# Patient Record
Sex: Female | Born: 1990 | Race: Black or African American | Hispanic: No | Marital: Single | Smoking: Never smoker
Health system: Southern US, Community
[De-identification: ages and names within clinical notes are randomized; demographics above are authoritative.]

## PROBLEM LIST (undated history)

## (undated) DIAGNOSIS — I2699 Other pulmonary embolism without acute cor pulmonale: Secondary | ICD-10-CM

## (undated) HISTORY — PX: NO PAST SURGERIES: SHX2092

---

## 2011-06-16 ENCOUNTER — Emergency Department (HOSPITAL_COMMUNITY)
Admission: EM | Admit: 2011-06-16 | Discharge: 2011-06-16 | Disposition: A | Discharge status: Home / Self Care | Payer: No Typology Code available for payment source | Attending: Emergency Medicine | Admitting: Emergency Medicine

## 2011-06-16 ENCOUNTER — Encounter (HOSPITAL_COMMUNITY): Payer: Self-pay | Admitting: Emergency Medicine

## 2011-06-16 DIAGNOSIS — S61209A Unspecified open wound of unspecified finger without damage to nail, initial encounter: Secondary | ICD-10-CM | POA: Insufficient documentation

## 2011-06-16 DIAGNOSIS — IMO0002 Reserved for concepts with insufficient information to code with codable children: Secondary | ICD-10-CM

## 2011-06-16 DIAGNOSIS — W260XXA Contact with knife, initial encounter: Secondary | ICD-10-CM | POA: Insufficient documentation

## 2011-06-16 MED ORDER — TETANUS-DIPHTH-ACELL PERTUSSIS 5-2.5-18.5 LF-MCG/0.5 IM SUSP
0.5000 mL | Freq: Once | INTRAMUSCULAR | Status: AC
  Administered 2011-06-16: 0.5 mL via INTRAMUSCULAR
  Filled 2011-06-16 (×2): qty 0.5

## 2011-06-16 NOTE — ED Provider Notes (Addendum)
History     CSN: [id number]  Arrival date & time 06/16/11  0026   First MD Initiated Contact with Patient 06/16/11 0054      Chief Complaint  Patient presents with  . Laceration    (Consider location/radiation/quality/duration/timing/severity/associated sxs/prior treatment) HPI Comments: Patient was trying to slice for separate frozen meat with a knife.  Now has a superficial laceration to her left thumb on the medial aspect at the nail edge.  No active bleeding.  This is a flap that I do not believe is viable at the distal edges due to discoloration and thinness of the flap  Patient is a 21 y.o. female presenting with skin laceration. The history is provided by the patient.  Laceration  The incident occurred 3 to 5 hours ago. The laceration mechanism was a a clean knife. The pain is at a severity of 2/10. The pain is mild. Her tetanus status is unknown.    History reviewed. No pertinent past medical history.  History reviewed. No pertinent past surgical history.  No family history on file.  History  Substance Use Topics  . Smoking status: Never Smoker   . Smokeless tobacco: Not on file  . Alcohol Use: No    OB History    Grav Para Term Preterm Abortions TAB SAB Ect Mult Living                  Review of Systems  Constitutional: Negative for activity change.    Allergies  Review of patient's allergies indicates no known allergies.  Home Medications  No current outpatient prescriptions on file.  BP 139/85  Pulse 91  Temp(Src) 98.6 F (37 C) (Oral)  Resp 18  SpO2 100%  LMP 06/01/2011  Physical Exam  Constitutional: She is oriented to person, place, and time. She appears well-developed and well-nourished.  HENT:  Head: Normocephalic.  Eyes: Pupils are equal, round, and reactive to light.  Neck: Normal range of motion.  Cardiovascular: Normal rate.   Pulmonary/Chest: Effort normal.  Neurological: She is alert and oriented to person, place, and time.    Skin:       Superficial flap of the medial left thumb, not including the nail    ED Course  LACERATION REPAIR Date/Time: 06/16/2011 1:14 AM Performed by: Arman Filter Authorized by: Arman Filter Consent: Verbal consent obtained. Risks and benefits: risks, benefits and alternatives were discussed Consent given by: patient Patient identity confirmed: verbally with patient Body area: upper extremity Laceration length: 1 cm Tendon involvement: none Nerve involvement: none Vascular damage: no Anesthetic total: 0 ml Patient sedated: no Irrigation solution: saline Amount of cleaning: extensive Skin closure: glue Patient tolerance: Patient tolerated the procedure well with no immediate complications.   (including critical care time)  Labs Reviewed - No data to display No results found.   1. Laceration       MDM  Superficial flap-type laceration to the medial left thumb from a knife.  No active bleeding.  Area has been cleaned and soaked in a Betadine solution.  Dermabond was used to keep the flap in place        Arman Filter, NP 06/16/11 0118  Arman Filter, NP 06/16/11 0118  Arman Filter, NP 07/11/11 2045

## 2011-06-16 NOTE — ED Provider Notes (Signed)
Medical screening examination/treatment/procedure(s) were performed by non-physician practitioner and as supervising physician I was immediately available for consultation/collaboration.  Ethelda Chick, MD 06/16/11 [phone]

## 2011-06-16 NOTE — ED Notes (Signed)
Left thumb put into Betadine soak by Earley Favor PA

## 2011-06-16 NOTE — ED Notes (Signed)
PT. PRESENTS WITH LACERATION AT LEFT DISTAL THUMB  , ACCIDENTALLY SLICED BY A KITCHEN KNIFE THIS EVENING , MINIMAL BLEEDING.

## 2011-06-16 NOTE — ED Notes (Signed)
Cut L thumb tip while trying to separate meat, cut with knife, knife slipped, "unknown Td", bleeding controlled, lac ~ 1cm, occurred around 2-3 hrs ago.

## 2011-07-12 NOTE — ED Provider Notes (Signed)
Medical screening examination/treatment/procedure(s) were performed by non-physician practitioner and as supervising physician I was immediately available for consultation/collaboration.  Ethelda Chick, MD 07/12/11 [phone]

## 2015-02-18 ENCOUNTER — Inpatient Hospital Stay (HOSPITAL_COMMUNITY)
Admission: EM | Admit: 2015-02-18 | Discharge: 2015-02-21 | DRG: 176 | Disposition: A | Discharge status: Home / Self Care | Payer: No Typology Code available for payment source | Attending: Internal Medicine | Admitting: Internal Medicine

## 2015-02-18 ENCOUNTER — Emergency Department (HOSPITAL_COMMUNITY): Payer: No Typology Code available for payment source

## 2015-02-18 ENCOUNTER — Encounter (HOSPITAL_COMMUNITY): Payer: Self-pay | Admitting: Emergency Medicine

## 2015-02-18 DIAGNOSIS — R5383 Other fatigue: Secondary | ICD-10-CM | POA: Diagnosis present

## 2015-02-18 DIAGNOSIS — Z793 Long term (current) use of hormonal contraceptives: Secondary | ICD-10-CM

## 2015-02-18 DIAGNOSIS — R0602 Shortness of breath: Secondary | ICD-10-CM

## 2015-02-18 DIAGNOSIS — Z791 Long term (current) use of non-steroidal anti-inflammatories (NSAID): Secondary | ICD-10-CM

## 2015-02-18 DIAGNOSIS — I2699 Other pulmonary embolism without acute cor pulmonale: Principal | ICD-10-CM

## 2015-02-18 DIAGNOSIS — Z6841 Body Mass Index (BMI) 40.0 and over, adult: Secondary | ICD-10-CM

## 2015-02-18 DIAGNOSIS — R05 Cough: Secondary | ICD-10-CM

## 2015-02-18 DIAGNOSIS — Z79899 Other long term (current) drug therapy: Secondary | ICD-10-CM

## 2015-02-18 DIAGNOSIS — R0781 Pleurodynia: Secondary | ICD-10-CM | POA: Diagnosis not present

## 2015-02-18 DIAGNOSIS — R042 Hemoptysis: Secondary | ICD-10-CM

## 2015-02-18 DIAGNOSIS — R059 Cough, unspecified: Secondary | ICD-10-CM

## 2015-02-18 LAB — I-STAT TROPONIN, ED: TROPONIN I, POC: 0 ng/mL (ref 0.00–0.08)

## 2015-02-18 LAB — CBC WITH DIFFERENTIAL/PLATELET
BASOS ABS: 0 10*3/uL (ref 0.0–0.1)
Basophils Relative: 0 %
EOS ABS: 0.1 10*3/uL (ref 0.0–0.7)
EOS PCT: 1 %
HCT: 40.2 % (ref 36.0–46.0)
Hemoglobin: 13.6 g/dL (ref 12.0–15.0)
LYMPHS ABS: 3.4 10*3/uL (ref 0.7–4.0)
LYMPHS PCT: 35 %
MCH: 28 pg (ref 26.0–34.0)
MCHC: 33.8 g/dL (ref 30.0–36.0)
MCV: 82.9 fL (ref 78.0–100.0)
MONO ABS: 0.8 10*3/uL (ref 0.1–1.0)
Monocytes Relative: 8 %
Neutro Abs: 5.6 10*3/uL (ref 1.7–7.7)
Neutrophils Relative %: 56 %
PLATELETS: 243 10*3/uL (ref 150–400)
RBC: 4.85 MIL/uL (ref 3.87–5.11)
RDW: 12.9 % (ref 11.5–15.5)
WBC: 9.9 10*3/uL (ref 4.0–10.5)

## 2015-02-18 LAB — I-STAT BETA HCG BLOOD, ED (MC, WL, AP ONLY)

## 2015-02-18 LAB — COMPREHENSIVE METABOLIC PANEL
ALT: 17 U/L (ref 14–54)
AST: 23 U/L (ref 15–41)
Albumin: 3.9 g/dL (ref 3.5–5.0)
Alkaline Phosphatase: 56 U/L (ref 38–126)
Anion gap: 7 (ref 5–15)
BUN: 12 mg/dL (ref 6–20)
CHLORIDE: 106 mmol/L (ref 101–111)
CO2: 25 mmol/L (ref 22–32)
Calcium: 9.6 mg/dL (ref 8.9–10.3)
Creatinine, Ser: 0.76 mg/dL (ref 0.44–1.00)
Glucose, Bld: 97 mg/dL (ref 65–99)
POTASSIUM: 4.2 mmol/L (ref 3.5–5.1)
SODIUM: 138 mmol/L (ref 135–145)
Total Bilirubin: 0.9 mg/dL (ref 0.3–1.2)
Total Protein: 8.5 g/dL — ABNORMAL HIGH (ref 6.5–8.1)

## 2015-02-18 LAB — LIPASE, BLOOD: LIPASE: 24 U/L (ref 22–51)

## 2015-02-18 MED ORDER — ONDANSETRON HCL 4 MG/2ML IJ SOLN
4.0000 mg | Freq: Three times a day (TID) | INTRAMUSCULAR | Status: DC | PRN
  Filled 2015-02-18: qty 2

## 2015-02-18 MED ORDER — ALBUTEROL SULFATE (2.5 MG/3ML) 0.083% IN NEBU
5.0000 mg | INHALATION_SOLUTION | Freq: Once | RESPIRATORY_TRACT | Status: AC
  Administered 2015-02-18: 5 mg via RESPIRATORY_TRACT
  Filled 2015-02-18: qty 6

## 2015-02-18 MED ORDER — OXYCODONE-ACETAMINOPHEN 5-325 MG PO TABS
2.0000 | ORAL_TABLET | Freq: Once | ORAL | Status: AC
  Administered 2015-02-18: 2 via ORAL
  Filled 2015-02-18: qty 2

## 2015-02-18 MED ORDER — IPRATROPIUM BROMIDE 0.02 % IN SOLN
0.5000 mg | Freq: Once | RESPIRATORY_TRACT | Status: AC
  Administered 2015-02-18: 0.5 mg via RESPIRATORY_TRACT
  Filled 2015-02-18: qty 2.5

## 2015-02-18 MED ORDER — IOHEXOL 350 MG/ML SOLN
100.0000 mL | Freq: Once | INTRAVENOUS | Status: AC | PRN
  Administered 2015-02-18: 100 mL via INTRAVENOUS

## 2015-02-18 MED ORDER — ENOXAPARIN SODIUM 150 MG/ML ~~LOC~~ SOLN
1.0000 mg/kg | Freq: Once | SUBCUTANEOUS | Status: AC
  Administered 2015-02-19: 195 mg via SUBCUTANEOUS
  Filled 2015-02-18: qty 1.3

## 2015-02-18 NOTE — ED Notes (Signed)
Made Respiratory aware of neb treatment ordered.  RN will administer.

## 2015-02-18 NOTE — ED Notes (Signed)
Patient transported to CT 

## 2015-02-18 NOTE — ED Provider Notes (Signed)
CSN: [id number]     Arrival date & time 02/18/15  1505 History   First MD Initiated Contact with Patient 02/18/15 1732     Chief Complaint  Patient presents with  . Cough     (Consider location/radiation/quality/duration/timing/severity/associated sxs/prior Treatment) Patient is a 24 y.o. female presenting with cough. The history is provided by the patient and medical records. No language interpreter was used.  Cough Associated symptoms: shortness of breath   Associated symptoms: no chest pain, no diaphoresis, no fever, no headaches, no rash and no wheezing     Cohen Doleman is a 24 y.o. female  with no major medical history presents to the Emergency Department complaining of gradual, persistent, progressively worsening cough, dyspnea on exertion, hemoptysis onset yesterday.  Patient reports midline chest pain since Wednesday.  She reports her chest pain is aggravated with deep breathing and is located more on the left side of her chest. It radiates from the left anterior chest to underneath the left breast. She reports associated dyspnea on exertion since this morning. She reports she does take oral contraception tablets but denies any additional exogenous estrogen. She denies palpitations. Patient denies recent travel. She specifically denies travel outside of the country and has no history of TB. She denies swelling in her legs, pain in her legs, history of DVT, recent fracture or surgery. No treatments prior to arrival. Nothing seems to make the symptoms better. Patient denies fever, chills, headache, neck pain, abdominal pain, nausea, vomiting, diarrhea, syncope, dysuria.   History reviewed. No pertinent past medical history. History reviewed. No pertinent past surgical history. History reviewed. No pertinent family history. Social History  Substance Use Topics  . Smoking status: Never Smoker   . Smokeless tobacco: None  . Alcohol Use: No   OB History    No data available      Review of Systems  Constitutional: Negative for fever, diaphoresis, appetite change, fatigue and unexpected weight change.  HENT: Negative for mouth sores.   Eyes: Negative for visual disturbance.  Respiratory: Positive for cough and shortness of breath. Negative for chest tightness and wheezing.        Hemoptysis  Cardiovascular: Negative for chest pain.  Gastrointestinal: Negative for nausea, vomiting, abdominal pain, diarrhea and constipation.  Endocrine: Negative for polydipsia, polyphagia and polyuria.  Genitourinary: Negative for dysuria, urgency, frequency and hematuria.  Musculoskeletal: Negative for back pain and neck stiffness.  Skin: Negative for rash.  Allergic/Immunologic: Negative for immunocompromised state.  Neurological: Negative for syncope, light-headedness and headaches.  Hematological: Does not bruise/bleed easily.  Psychiatric/Behavioral: Negative for sleep disturbance. The patient is not nervous/anxious.       Allergies  Review of patient's allergies indicates no known allergies.  Home Medications   Prior to Admission medications   Medication Sig Start Date End Date Taking? Authorizing Provider  cyclobenzaprine (FLEXERIL) 5 MG tablet Take 5 mg by mouth 3 (three) times daily as needed for muscle spasms.   Yes Historical Provider, MD  ibuprofen (ADVIL,MOTRIN) 800 MG tablet Take 800 mg by mouth every 8 (eight) hours as needed for fever, headache, mild pain, moderate pain or cramping.   Yes Historical Provider, MD  levonorgestrel-ethinyl estradiol (LEVORA 0.15/30, 28,) 0.15-30 MG-MCG tablet Take 1 tablet by mouth daily.   Yes Historical Provider, MD  naproxen (NAPROSYN) 500 MG tablet Take 500 mg by mouth 2 (two) times daily.   Yes Historical Provider, MD   BP 128/77 mmHg  Pulse 104  Temp(Src) 98.2 F (36.8 C) (Oral)  Resp 23  SpO2 97%  LMP 02/16/2015 Physical Exam  Constitutional: She appears well-developed and well-nourished. No distress.  Awake,  alert, nontoxic appearance Obese  HENT:  Head: Normocephalic and atraumatic.  Mouth/Throat: Oropharynx is clear and moist. No oropharyngeal exudate.  Eyes: Conjunctivae are normal. No scleral icterus.  Neck: Normal range of motion. Neck supple.  Cardiovascular: Regular rhythm, normal heart sounds and intact distal pulses.  Tachycardia present.   Pulses:      Radial pulses are 2+ on the right side, and 2+ on the left side.  Capillary refill < 3 sec  Pulmonary/Chest: Effort normal. No respiratory distress. She has decreased breath sounds (throughout). She has no wheezes.  Equal chest expansion Diminished breath sounds throughout  Abdominal: Soft. Bowel sounds are normal. She exhibits no mass. There is no tenderness. There is no rebound and no guarding.  Soft and nontender  Musculoskeletal: Normal range of motion. She exhibits no edema.  No calf tenderness No pitting edema Negative Homans sign   Neurological: She is alert.  Speech is clear and goal oriented Moves extremities without ataxia  Skin: Skin is warm and dry. She is not diaphoretic.  Psychiatric: She has a normal mood and affect.  Nursing note and vitals reviewed.   ED Course  Procedures (including critical care time) Labs Review Labs Reviewed  COMPREHENSIVE METABOLIC PANEL - Abnormal; Notable for the following:    Total Protein 8.5 (*)    All other components within normal limits  CBC WITH DIFFERENTIAL/PLATELET  LIPASE, BLOOD  ANTITHROMBIN III  PROTEIN C ACTIVITY  PROTEIN C, TOTAL  PROTEIN S ACTIVITY  PROTEIN S, TOTAL  LUPUS ANTICOAGULANT PANEL  BETA-2-GLYCOPROTEIN I ABS, IGG/M/A  HOMOCYSTEINE  FACTOR 5 LEIDEN  PROTHROMBIN GENE MUTATION  CARDIOLIPIN ANTIBODIES, IGG, IGM, IGA  BRAIN NATRIURETIC PEPTIDE  I-STAT TROPOININ, ED  I-STAT BETA HCG BLOOD, ED ([facility], [facility], [facility] ONLY)    Imaging Review Dg Chest 2 View  02/18/2015   CLINICAL DATA:  Midsternal chest pain with cough.  EXAM: CHEST  2 VIEW  COMPARISON:  None.   FINDINGS: There is no focal parenchymal opacity. There is no pleural effusion or pneumothorax. There is cardiomegaly.  The osseous structures are unremarkable.  IMPRESSION: Cardiomegaly.  Otherwise, no acute cardiopulmonary disease.   Electronically Signed   By: Elige Ko   On: 02/18/2015 16:01   Ct Angio Chest Pe W/cm &/or Wo Cm  02/18/2015   CLINICAL DATA:  Short of breath with chest pain and hemoptysis today.  EXAM: CT ANGIOGRAPHY CHEST WITH CONTRAST  TECHNIQUE: Multidetector CT imaging of the chest was performed using the standard protocol during bolus administration of intravenous contrast. Multiplanar CT image reconstructions and MIPs were obtained to evaluate the vascular anatomy.  CONTRAST:  OMNIPAQUE IOHEXOL 350 MG/ML SOLN  COMPARISON:  Chest radiography same day  FINDINGS: The examination is quite limited because of the patient's large size and poor bolus timing. Nonetheless, there is definite pulmonary embolism with emboli visible in the main pulmonary artery bifurcation on the left and in the right lower lobe pulmonary arterial tree. Difficulty measure the right ventricular to left ventricular ratio, but I do not think there is evidence of right heart strain. There is patchy atelectasis in both lower lobes related to the emboli. No pleural or pericardial fluid. No spinal abnormality.  Review of the MIP images confirms the above findings.  IMPRESSION: Limited quality study, but definitely positive for bilateral pulmonary emboli. I do not think the study shows  evidence of right heart strain.  These results were called by telephone at the time of interpretation on 02/18/2015 at 10:00 pm to Dr. Dierdre Forth , who verbally acknowledged these results.   Electronically Signed   By: Paulina Fusi M.D.   On: 02/18/2015 22:02   I have personally reviewed and evaluated these images and lab results as part of my medical decision-making.   EKG Interpretation None      MDM   Final  diagnoses:  Other acute pulmonary embolism without acute cor pulmonale (HCC)  SOB (shortness of breath)  Cough  Hemoptysis   Shaneque Merkle presents with cough, dyspnea on exertion and hemoptysis. Patient coughs up bloody sputum while here in the emergency department. Mild tachycardia to 105 on arrival. Will give albuterol and obtain CT angiogram.  10:46 PM Patient with bilateral PE on CT angios scan. Anticoagulation panel drawn. Will need admission.  11:18 PM Discussed with Dr. Dr. Robb Matar who will admit to tele.  Pt will be given lovenox.    BP 128/77 mmHg  Pulse 104  Temp(Src) 98.2 F (36.8 C) (Oral)  Resp 23  SpO2 97%  LMP 02/16/2015   Dahlia Client Prarthana Parlin, PA-C 02/18/15 1610  Alvira Monday, MD 02/22/15 1732

## 2015-02-18 NOTE — ED Notes (Addendum)
Pt reports midline chest discomfort since Wednesday associated with belching. On Thursday she began to have a cough with bloody sputum. Pt having pain on L side chest only with deep breathing. Also reports sob worse than usual. Pt speaking in full sentences with no difficulty. Also having L shoulder pain, but thinks it may be related to sleeping on it the wrong way.

## 2015-02-18 NOTE — ED Notes (Signed)
Spoke with main lab phlebotomy. Pt has already been stuck multiple times for panel. Main lab phlebotomy to come down and stick pt

## 2015-02-19 ENCOUNTER — Encounter (HOSPITAL_COMMUNITY): Payer: Self-pay | Admitting: *Deleted

## 2015-02-19 ENCOUNTER — Inpatient Hospital Stay (HOSPITAL_COMMUNITY): Payer: No Typology Code available for payment source

## 2015-02-19 DIAGNOSIS — Z793 Long term (current) use of hormonal contraceptives: Secondary | ICD-10-CM

## 2015-02-19 DIAGNOSIS — I2699 Other pulmonary embolism without acute cor pulmonale: Secondary | ICD-10-CM

## 2015-02-19 LAB — CBC
HCT: 39.5 % (ref 36.0–46.0)
HEMOGLOBIN: 12.4 g/dL (ref 12.0–15.0)
MCH: 26.7 pg (ref 26.0–34.0)
MCHC: 31.4 g/dL (ref 30.0–36.0)
MCV: 85.1 fL (ref 78.0–100.0)
Platelets: 237 10*3/uL (ref 150–400)
RBC: 4.64 MIL/uL (ref 3.87–5.11)
RDW: 13.3 % (ref 11.5–15.5)
WBC: 9 10*3/uL (ref 4.0–10.5)

## 2015-02-19 LAB — BRAIN NATRIURETIC PEPTIDE: B NATRIURETIC PEPTIDE 5: 25.3 pg/mL (ref 0.0–100.0)

## 2015-02-19 LAB — COMPREHENSIVE METABOLIC PANEL
ALT: 15 U/L (ref 14–54)
ANION GAP: 8 (ref 5–15)
AST: 16 U/L (ref 15–41)
Albumin: 3.4 g/dL — ABNORMAL LOW (ref 3.5–5.0)
Alkaline Phosphatase: 51 U/L (ref 38–126)
BUN: 14 mg/dL (ref 6–20)
CHLORIDE: 105 mmol/L (ref 101–111)
CO2: 25 mmol/L (ref 22–32)
Calcium: 9.2 mg/dL (ref 8.9–10.3)
Creatinine, Ser: 0.82 mg/dL (ref 0.44–1.00)
GFR calc Af Amer: >60 mL/min (ref >60)
GFR calc non Af Amer: >60 mL/min (ref >60)
Glucose, Bld: 109 mg/dL — ABNORMAL HIGH (ref 65–99)
Potassium: 3.7 mmol/L (ref 3.5–5.1)
SODIUM: 138 mmol/L (ref 135–145)
Total Bilirubin: 0.7 mg/dL (ref 0.3–1.2)
Total Protein: 7.8 g/dL (ref 6.5–8.1)

## 2015-02-19 LAB — PROTIME-INR
INR: 1.19 (ref 0.00–1.49)
PROTHROMBIN TIME: 15.3 s — AB (ref 11.6–15.2)

## 2015-02-19 LAB — ANTITHROMBIN III: ANTITHROMB III FUNC: 93 % (ref 75–120)

## 2015-02-19 MED ORDER — PATIENT'S GUIDE TO USING COUMADIN BOOK
Freq: Once | Status: AC
  Administered 2015-02-19: 1
  Filled 2015-02-19: qty 1

## 2015-02-19 MED ORDER — ONDANSETRON HCL 4 MG/2ML IJ SOLN: 4.0000 mg | Freq: Four times a day (QID) | INTRAMUSCULAR | Status: AC | PRN

## 2015-02-19 MED ORDER — SODIUM CHLORIDE 0.9 % IJ SOLN
3.0000 mL | Freq: Two times a day (BID) | INTRAMUSCULAR | Status: DC
  Administered 2015-02-19 – 2015-02-21 (×6): 3 mL via INTRAVENOUS

## 2015-02-19 MED ORDER — ENOXAPARIN SODIUM 150 MG/ML ~~LOC~~ SOLN
200.0000 mg | Freq: Two times a day (BID) | SUBCUTANEOUS | Status: DC
  Administered 2015-02-19: 200 mg via SUBCUTANEOUS
  Filled 2015-02-19 (×5): qty 2

## 2015-02-19 MED ORDER — HYDROMORPHONE HCL 1 MG/ML IJ SOLN: 1.0000 mg | INTRAMUSCULAR | Status: DC | PRN

## 2015-02-19 MED ORDER — HYDROMORPHONE HCL 1 MG/ML IJ SOLN
1.0000 mg | INTRAMUSCULAR | Status: DC | PRN
  Administered 2015-02-19 (×5): 1 mg via INTRAVENOUS
  Filled 2015-02-19 (×5): qty 1

## 2015-02-19 MED ORDER — FAMOTIDINE 20 MG PO TABS
40.0000 mg | ORAL_TABLET | Freq: Two times a day (BID) | ORAL | Status: DC
  Administered 2015-02-19 – 2015-02-21 (×6): 40 mg via ORAL
  Filled 2015-02-19 (×6): qty 2

## 2015-02-19 MED ORDER — HYDROMORPHONE HCL 2 MG/ML IJ SOLN
2.0000 mg | Freq: Once | INTRAMUSCULAR | Status: AC
  Administered 2015-02-20: 2 mg via INTRAVENOUS
  Filled 2015-02-19: qty 1

## 2015-02-19 MED ORDER — ENOXAPARIN SODIUM 150 MG/ML ~~LOC~~ SOLN
200.0000 mg | Freq: Two times a day (BID) | SUBCUTANEOUS | Status: DC
  Filled 2015-02-19 (×2): qty 2

## 2015-02-19 MED ORDER — WARFARIN SODIUM 5 MG PO TABS
10.0000 mg | ORAL_TABLET | Freq: Once | ORAL | Status: AC
  Administered 2015-02-19: 10 mg via ORAL
  Filled 2015-02-19: qty 2

## 2015-02-19 MED ORDER — WARFARIN - PHARMACIST DOSING INPATIENT
Freq: Every day | Status: DC
Start: 2015-02-19 — End: 2015-02-21
  Administered 2015-02-19: 18:00:00

## 2015-02-19 MED ORDER — KETOROLAC TROMETHAMINE 30 MG/ML IJ SOLN
30.0000 mg | Freq: Once | INTRAMUSCULAR | Status: AC
  Administered 2015-02-19: 30 mg via INTRAVENOUS
  Filled 2015-02-19: qty 1

## 2015-02-19 MED ORDER — OXYCODONE-ACETAMINOPHEN 5-325 MG PO TABS
1.0000 | ORAL_TABLET | ORAL | Status: DC | PRN
  Administered 2015-02-20 (×3): 1 via ORAL
  Filled 2015-02-19 (×3): qty 1

## 2015-02-19 NOTE — Progress Notes (Signed)
Pt admitted after midnight. Please refer to admission note done today 02/19/2015.  Pt admitted for new pulmonary embolism Started lovenox on admission Add coumadin tonight  Manson Passey [facility] [phone]

## 2015-02-19 NOTE — Progress Notes (Signed)
VASCULAR LAB PRELIMINARY  PRELIMINARY  PRELIMINARY  PRELIMINARY  Bilateral lower extremity venous duplex completed.    Preliminary report:  There is no obvious evidence of DVT or SVT noted in the visualized veins of the bilateral lower extremities.  However, this was a technically difficult and limited study secondary to body habitus and inability to lay back during exam.  Shondra Capps, RVT 02/19/2015, 5:41 PM

## 2015-02-19 NOTE — Progress Notes (Addendum)
ANTICOAGULATION CONSULT NOTE - Initial Consult  Pharmacy Consult for Warfarin/Lovenox Indication: pulmonary embolus  No Known Allergies  Patient Measurements: Height:  (165.1 cm) Weight: (!) 427 lb 8 oz (193.913 kg) IBW/kg (Calculated) : 57  Vital Signs: Temp: 98.7 F (37.1 C) (10/08 1421) Temp Source: Oral (10/08 1421) BP: 132/82 mmHg (10/08 1421) Pulse Rate: 84 (10/08 1421)  Labs:  Recent Labs  02/18/15 1743 02/19/15 0535  HGB 13.6 12.4  HCT 40.2 39.5  PLT 243 237  CREATININE 0.76 0.82   Estimated Creatinine Clearance: 188.3 mL/min (by C-G formula based on Cr of 0.82).  Medical History: History reviewed. No pertinent past medical history.  Medications:  Scheduled:  . famotidine  40 mg Oral BID  . sodium chloride  3 mL Intravenous Q12H   Assessment: 24 yoF with PE, on BCP prior to admission. Has received one dose of Lovenox 195 mg in ED early this am, needs to continue until INR therapeutic x 48 hr. Pharmacy to dose Warfarin, requested Pharmacy protocol for Lovenox as well, waiting for response  Goal of Therapy:  INR 2-3 Anti-Xa level 0.6-1 units/ml 4hrs after LMWH dose given Monitor platelets by anticoagulation protocol: Yes   Plan:   Hyper-coag panel in process  Ordered baseline INR and daily, dose Warfarin based on level  Waiting for MD to order Lovenox dosing  Otho Bellows PharmD Pager [phone] 02/19/2015, 3:06 PM   Addendum:  Pharmacy now consulted to dose Lovenox Warfarin  PO x 1 tonight Daily PT/INR Provide warfarin education prior to discharge Lovenox 1 mg/kg ( ) SQ q12h Monitor renal function, CBC, signs/symptoms of bleeding  Loralee Pacas, PharmD, BCPS Pager: [phone] 02/19/2015 3:48 PM

## 2015-02-19 NOTE — H&P (Signed)
[facility] History and Physical  Regina Koch ZOX:[id number] DOB: 03-15-1991 DOA: 02/18/2015  Referring physician: Dierdre Forth, PA-C PCP: No primary care provider on file.   Chief Complaint: Cough.  HPI: Regina Koch is a 24 y.o. female with a past medical history of morbid obesity who comes to the ER due to shortness of breath for 2 days associated with pleuritic chest pain, hemoptysis. Per patient, on Wednesday evening she had sudden chest pain, associated with mild dyspnea and fatigue. She took some over-the-counter analgesics, but could not sleep very well. Next day she noticed some hemoptysis, and she called her doctor and was prescribed Flexeril, which did not help enough with the pain. She denies any long air or motor vehicle travel. She denies any long sitting periods. She denies any previous history of thromboembolism or family history of it. She uses birth control pills, but denies cigarette smoking.  She denies dizziness, diaphoresis, palpitations, orthopnea, PND or pitting edema of the lower extremities. She complains of mild nausea, but denies emesis, diarrhea, constipation, melena, hematochezia. She denies urinary symptoms.  When seen in the emergency department, the patient was in no acute distress. She stated that her pain was better, but still at about 5/10 in intensity. She is stated that she was breathing better with the supplemental oxygen.  Review of Systems:  Constitutional:  Positive fatigue. No weight loss, night sweats, Fevers, chills,   HEENT:  No headaches, Difficulty swallowing,Tooth/dental problems,Sore throat,  No sneezing, itching, ear ache, nasal congestion, post nasal drip,  Cardio-vascular:  No chest pain, Orthopnea, PND, swelling in lower extremities, anasarca, dizziness, palpitations  GI:  Positive nausea. No heartburn, indigestion, abdominal pain,  vomiting, diarrhea, change in bowel habits, loss of appetite  Resp:  Positive  shortness of breath with exertion or at rest. Positive hemoptysis. Positive pleuritic chest pain.  No excess mucus, no productive cough, No non-productive cough.No change in color of mucus.No wheezing.No chest wall deformity  Skin:  no rash or lesions.  GU:  no dysuria, change in color of urine, no urgency or frequency. No flank pain.  Musculoskeletal:  No joint pain or swelling. No decreased range of motion. No back pain.  Psych:  No change in mood or affect. No depression or anxiety. No memory loss.   History reviewed. No pertinent past medical history. History reviewed. No pertinent past surgical history. Social History:  reports that she has never smoked. She does not have any smokeless tobacco history on file. She reports that she does not drink alcohol or use illicit drugs.  No Known Allergies  History reviewed. No pertinent family history.   Prior to Admission medications   Medication Sig Start Date End Date Taking? Authorizing Provider  cyclobenzaprine (FLEXERIL) 5 MG tablet Take 5 mg by mouth 3 (three) times daily as needed for muscle spasms.   Yes Historical Provider, MD  ibuprofen (ADVIL,MOTRIN) 800 MG tablet Take 800 mg by mouth every 8 (eight) hours as needed for fever, headache, mild pain, moderate pain or cramping.   Yes Historical Provider, MD  levonorgestrel-ethinyl estradiol (LEVORA 0.15/30, 28,) 0.15-30 MG-MCG tablet Take 1 tablet by mouth daily.   Yes Historical Provider, MD  naproxen (NAPROSYN) 500 MG tablet Take 500 mg by mouth 2 (two) times daily.   Yes Historical Provider, MD   Physical Exam: Filed Vitals:   02/18/15 2100 02/18/15 2130 02/18/15 2317 02/19/15 0016  BP: 146/84 128/77  154/99  Pulse: 107 104  105  Temp:    99.7  F (37.6 C)  TempSrc:    Oral  Resp: Height:     (1.651 m)  Weight:   193.913 kg (427 lb 8 oz) 193.686 kg (427 lb)  SpO2: 98% 97%  98%    Wt Readings from Last 3 Encounters:  02/19/15 193.686 kg (427 lb)     General:  Appears calm and comfortable Eyes: PERRL, normal lids, irises & conjunctiva ENT: grossly normal hearing, lips & tongue Neck: no LAD, masses or thyromegaly Cardiovascular: RRR, no m/r/g. No LE edema. Telemetry: SR, no arrhythmias  Respiratory: CTA bilaterally, no w/r/r. Normal respiratory effort. Abdomen: Obese, soft, ntnd Skin: no rash or induration seen on limited exam Musculoskeletal: grossly normal tone BUE/BLE Psychiatric: grossly normal mood and affect, speech fluent and appropriate Neurologic: grossly non-focal.          Labs on Admission:  Basic Metabolic Panel:  Recent Labs Lab 02/18/15 1743  NA 138  K 4.2  CL 106  CO2 25  GLUCOSE 97  BUN 12  CREATININE 0.76  CALCIUM 9.6   Liver Function Tests:  Recent Labs Lab 02/18/15 1743  AST 23  ALT 17  ALKPHOS 56  BILITOT 0.9  PROT 8.5*  ALBUMIN 3.9    Recent Labs Lab 02/18/15 1743  LIPASE 24   CBC:  Recent Labs Lab 02/18/15 1743  WBC 9.9  NEUTROABS 5.6  HGB 13.6  HCT 40.2  MCV 82.9  PLT 243    BNP (last 3 results)  Recent Labs  02/18/15 2318  BNP 25.3     Radiological Exams on Admission: Dg Chest 2 View  02/18/2015   CLINICAL DATA:  Midsternal chest pain with cough.  EXAM: CHEST  2 VIEW  COMPARISON:  None.  FINDINGS: There is no focal parenchymal opacity. There is no pleural effusion or pneumothorax. There is cardiomegaly.  The osseous structures are unremarkable.  IMPRESSION: Cardiomegaly.  Otherwise, no acute cardiopulmonary disease.   Electronically Signed   By: Elige Ko   On: 02/18/2015 16:01   Ct Angio Chest Pe W/cm &/or Wo Cm  02/18/2015   CLINICAL DATA:  Short of breath with chest pain and hemoptysis today.  EXAM: CT ANGIOGRAPHY CHEST WITH CONTRAST  TECHNIQUE: Multidetector CT imaging of the chest was performed using the standard protocol during bolus administration of intravenous contrast. Multiplanar CT image reconstructions and MIPs were obtained to evaluate the  vascular anatomy.  CONTRAST:  OMNIPAQUE IOHEXOL 350 MG/ML SOLN  COMPARISON:  Chest radiography same day  FINDINGS: The examination is quite limited because of the patient's large size and poor bolus timing. Nonetheless, there is definite pulmonary embolism with emboli visible in the main pulmonary artery bifurcation on the left and in the right lower lobe pulmonary arterial tree. Difficulty measure the right ventricular to left ventricular ratio, but I do not think there is evidence of right heart strain. There is patchy atelectasis in both lower lobes related to the emboli. No pleural or pericardial fluid. No spinal abnormality.  Review of the MIP images confirms the above findings.  IMPRESSION: Limited quality study, but definitely positive for bilateral pulmonary emboli. I do not think the study shows evidence of right heart strain.  These results were called by telephone at the time of interpretation on 02/18/2015 at 10:00 pm to Dr. Dierdre Forth , who verbally acknowledged these results.   Electronically Signed   By: Paulina Fusi M.D.   On: 02/18/2015 22:02    EKG:  Independently reviewed. Vent. rate 95 BPM PR interval 165 ms QRS duration 98 ms QT/QTc 355/446 ms P-R-T axes 34 55 30 Sinus rhythm ST elev, probable normal early repol pattern  Assessment/Plan Principal Problem:   Bilateral pulmonary embolism (HCC) Admit to telemetry for cardiac monitoring. Lovenox at therapeutic dosage. Check echocardiogram in the morning to evaluate for possible right-sided heart strain. Check antithrombin III, protein C and protein S activity, check total protein C and total protein S, Check homocystine, check lupus anticoagulant, check factor V Leyden, check cardiolipin antibodies, check prothrombin gene mutation, check beta 2 glycoprotein antibodies. Patient to discontinue oral contraceptive pills and start lifestyle modifications for obesity to decrease further risk of embolic events.  Active  Problems:     Morbid obesity (HCC) Recommended lifestyle modifications.    Code Status: Full code. DVT Prophylaxis: On full dose anticoagulation with Lovenox. Family Communication:  Disposition Plan: Admit to telemetry for cardiac monitoring and start Lovenox at therapeutic doses.  Time spent: Over 60 minutes were spent in the process of this admission.  Bobette Mo [facility] Pager [phone].

## 2015-02-20 DIAGNOSIS — R0781 Pleurodynia: Secondary | ICD-10-CM

## 2015-02-20 LAB — PROTIME-INR
INR: 1.19 (ref 0.00–1.49)
Prothrombin Time: 15.2 seconds (ref 11.6–15.2)

## 2015-02-20 MED ORDER — ENOXAPARIN SODIUM 150 MG/ML ~~LOC~~ SOLN
200.0000 mg | Freq: Two times a day (BID) | SUBCUTANEOUS | Status: DC
  Filled 2015-02-20 (×3): qty 2

## 2015-02-20 MED ORDER — ENOXAPARIN SODIUM 150 MG/ML ~~LOC~~ SOLN
200.0000 mg | SUBCUTANEOUS | Status: AC
  Administered 2015-02-20: 200 mg via SUBCUTANEOUS
  Filled 2015-02-20: qty 2

## 2015-02-20 MED ORDER — ENOXAPARIN SODIUM 150 MG/ML ~~LOC~~ SOLN
200.0000 mg | Freq: Two times a day (BID) | SUBCUTANEOUS | Status: DC
  Administered 2015-02-20 – 2015-02-21 (×2): 200 mg via SUBCUTANEOUS
  Filled 2015-02-20: qty 1.33
  Filled 2015-02-20: qty 2

## 2015-02-20 MED ORDER — WARFARIN SODIUM 5 MG PO TABS
10.0000 mg | ORAL_TABLET | Freq: Once | ORAL | Status: AC
  Administered 2015-02-20: 10 mg via ORAL
  Filled 2015-02-20: qty 2

## 2015-02-20 NOTE — Progress Notes (Addendum)
Patient ID: Regina Koch, female   DOB: September 08, 1990, 24 y.o.   MRN: [id number] [facility] PROGRESS NOTE  Marty Sadlowski WUJ:[id number] DOB: 1990-07-19 DOA: 02/18/2015 PCP: No primary care provider on file. - will set up with [facility] on discharge   Brief narrative:    24 y.o. female with a past medical history of morbid obesity, on OCP for menstrual bleeding control who presented to [facility] ED with worsening shortness of breath for 2 days piror to this admission. Pt also had associated chest pain with breathing. Pt was found to have pulmonary embolism and was started on Lovenox.   Anticipated discharge: 10/10 if she goes home on xarelto otherwise home once INR 2-3.  Assessment/Plan:    Principal Problem:   Bilateral pulmonary embolism (HCC) /  On oral contraceptive pills for non-contraception indication / Pleuritic chest pain  - Likely triggered by OCP use - CT angio chest showed bilateral pulmonary emboli. No evidence of right heart strain. - She is on Lovenox and coumadin - INR 1.19 this am - Continue to monitor daily INR  Active Problems:   Morbid obesity (HCC) - Counseled on diet    DVT Prophylaxis  - On Lovenox and coumadin for PE   Code Status: Full.  Family Communication:  plan of care discussed with the patient Disposition Plan: Home 10/10.  IV access:  Peripheral IV  Procedures and diagnostic studies:    Dg Chest 2 View 02/18/2015   Cardiomegaly.  Otherwise, no acute cardiopulmonary disease.   Electronically Signed   By: Elige Ko   On: 02/18/2015 16:01   Ct Angio Chest Pe W/cm &/or Wo Cm 02/18/2015  Limited quality study, but definitely positive for bilateral pulmonary emboli. I do not think the study shows evidence of right heart strain.  These results were called by telephone at the time of interpretation on 02/18/2015 at 10:00 pm to Dr. Dierdre Forth , who verbally acknowledged these results.   Electronically Signed   By: Paulina Fusi M.D.   On: 02/18/2015 22:02    LE doppler 02/19/2015 - no DVT   Medical Consultants:  None   Other Consultants:  None   IAnti-Infectives:   None    Manson Passey, MD  [facility] Pager [phone]  Time spent in minutes: 25 minutes  If 7PM-7AM, please contact night-coverage [email or web address] Password [email or web address] 02/20/2015, 10:56 AM   LOS: 2 days    HPI/Subjective: No acute overnight events. Patient reports having chest pain with coughing.   Objective: Filed Vitals:   02/19/15 1421 02/19/15 2141 02/20/15 0601 02/20/15 0841  BP: 132/82 146/97 143/96 107/51  Pulse: 84 102 90 88  Temp: 98.7 F (37.1 C) 99 F (37.2 C) 98.9 F (37.2 C) 98.6 F (37 C)  TempSrc: Oral Oral Oral Oral  Resp: Height:      Weight:      SpO2: 98% 98% 97% 97%    Intake/Output Summary (Last 24 hours) at 02/20/15 1056 Last data filed at 02/20/15 1049  Gross per 24 hour  Intake    243 ml  Output      2 ml  Net    241 ml    Exam:   General:  Pt is alert, follows commands appropriately, not in acute distress  Cardiovascular: Regular rate and rhythm, S1/S2, no murmurs  Respiratory: Clear to auscultation bilaterally, no wheezing, no crackles, no rhonchi  Abdomen: Soft, non tender, non distended, bowel sounds present  Extremities: No edema, pulses  DP and PT palpable bilaterally  Neuro: Grossly nonfocal  Data Reviewed: Basic Metabolic Panel:  Recent Labs Lab 02/18/15 1743 02/19/15 0535  NA 138 138  K 4.2 3.7  CL 106 105  CO2 25 25  GLUCOSE 97 109*  BUN 12 14  CREATININE 0.76 0.82  CALCIUM 9.6 9.2   Liver Function Tests:  Recent Labs Lab 02/18/15 1743 02/19/15 0535  AST 23 16  ALT 17 15  ALKPHOS 56 51  BILITOT 0.9 0.7  PROT 8.5* 7.8  ALBUMIN 3.9 3.4*    Recent Labs Lab 02/18/15 1743  LIPASE 24   No results for input(s): AMMONIA in the last 168 hours. CBC:  Recent Labs Lab 02/18/15 1743 02/19/15 0535  WBC 9.9 9.0  NEUTROABS 5.6  --   HGB 13.6 12.4  HCT 40.2 39.5  MCV  82.9 85.1  PLT 243 237   Cardiac Enzymes: No results for input(s): CKTOTAL, CKMB, CKMBINDEX, TROPONINI in the last 168 hours. BNP: Invalid input(s): POCBNP CBG: No results for input(s): GLUCAP in the last 168 hours.  No results found for this or any previous visit (from the past 240 hour(s)).   Scheduled Meds: . enoxaparin (LOVENOX) injection  200 mg Subcutaneous Q12H  . famotidine  40 mg Oral BID  . sodium chloride  3 mL Intravenous Q12H  . warfarin  10 mg Oral ONCE-1800  . Warfarin - Pharmacist Dosing Inpatient   Does not apply [phone]

## 2015-02-20 NOTE — Discharge Instructions (Addendum)
Information on my medicine - XARELTO (rivaroxaban)  This medication education was reviewed with me or my healthcare representative as part of my discharge preparation.  The pharmacist that spoke with me during my hospital stay was:  Clance Boll, [facility]  WHY WAS XARELTO PRESCRIBED FOR YOU? Xarelto was prescribed to treat blood clots that may have been found in the veins of your legs (deep vein thrombosis) or in your lungs (pulmonary embolism) and to reduce the risk of them occurring again.  What do you need to know about Xarelto? The starting dose is one 15 mg tablet taken TWICE daily with food for the FIRST 21 DAYS then on (enter date)  03/14/2015  the dose is changed to one 20 mg tablet taken ONCE A DAY with your evening meal.  DO NOT stop taking Xarelto without talking to the health care provider who prescribed the medication.  Refill your prescription for 20 mg tablets before you run out.  After discharge, you should have regular check-up appointments with your healthcare provider that is prescribing your Xarelto.  In the future your dose may need to be changed if your kidney function changes by a significant amount.  What do you do if you miss a dose? If you are taking Xarelto TWICE DAILY and you miss a dose, take it as soon as you remember. You may take two 15 mg tablets (total 30 mg) at the same time then resume your regularly scheduled 15 mg twice daily the next day.  If you are taking Xarelto ONCE DAILY and you miss a dose, take it as soon as you remember on the same day then continue your regularly scheduled once daily regimen the next day. Do not take two doses of Xarelto at the same time.   Important Safety Information Xarelto is a blood thinner medicine that can cause bleeding. You should call your healthcare provider right away if you experience any of the following: ? Bleeding from an injury or your nose that does not stop. ? Unusual colored urine (red or dark brown) or  unusual colored stools (red or black). ? Unusual bruising for unknown reasons. ? A serious fall or if you hit your head (even if there is no bleeding).  Some medicines may interact with Xarelto and might increase your risk of bleeding while on Xarelto. To help avoid this, consult your healthcare provider or pharmacist prior to using any new prescription or non-prescription medications, including herbals, vitamins, non-steroidal anti-inflammatory drugs (NSAIDs) and supplements.  This website has more information on Xarelto: VisitDestination.com.br.

## 2015-02-20 NOTE — Progress Notes (Signed)
ANTICOAGULATION CONSULT NOTE - Follow Up Consult  Pharmacy Consult for lovenox and coumadin  Indication: new bilateral pulmonary embolus  No Known Allergies  Patient Measurements: Height:  (165.1 cm) Weight: (!) 427 lb 8 oz (193.913 kg) IBW/kg (Calculated) : 57  Vital Signs: Temp: 98.6 F (37 C) (10/09 0841) Temp Source: Oral (10/09 0841) BP: 107/51 mmHg (10/09 0841) Pulse Rate: 88 (10/09 0841)  Labs:  Recent Labs  02/18/15 1743 02/19/15 0535 02/19/15 1525 02/20/15 0505  HGB 13.6 12.4  --   --   HCT 40.2 39.5  --   --   PLT 243 237  --   --   LABPROT  --   --  15.3* 15.2  INR  --   --  1.19 1.19  CREATININE 0.76 0.82  --   --     Estimated Creatinine Clearance: 188.3 mL/min (by C-G formula based on Cr of 0.82).  Assessment: Patient is a 24 y.o obese  F on birth control pills PTA who presented to the ED on 10/7 with c/o CP and hemoptysis.  Chest CTA showed bilateral PEs. LE doppler on 10/8 was negative for DVT. She's currently on lovenox and coumadin for new PE.  Today, 02/20/2015: - anticoagulation overlap day # 2 of 5 days minimum - INR 1.19 with first dose of coumadin ( ) given last night - scr 0.82 (crcl>30), no bleeding documented - cbc stable on 10/8 - AT III wnl - protein C and S, lupus anticoag pending - drug-drug intxn: no significant intxn noted  Goal of Therapy:  Anti-Xa level 0.6-1 units/ml 4hrs after LMWH dose given Monitor platelets by anticoagulation protocol: Yes   Plan:  - continue with lovenox 200 mg SQ q12h - with high BMI, will check anti-xa level for lovenox on 10/10 to assess current regimen - repeat coumadin 10 mg PO x1 today - monitor for s/s bleeding - daily INR and cbc q72h  Alexsa Flaum P 02/20/2015,8:52 AM

## 2015-02-21 DIAGNOSIS — I2699 Other pulmonary embolism without acute cor pulmonale: Principal | ICD-10-CM

## 2015-02-21 DIAGNOSIS — Z793 Long term (current) use of hormonal contraceptives: Secondary | ICD-10-CM

## 2015-02-21 LAB — CBC
HCT: 39.3 % (ref 36.0–46.0)
Hemoglobin: 12.6 g/dL (ref 12.0–15.0)
MCH: 27 pg (ref 26.0–34.0)
MCHC: 32.1 g/dL (ref 30.0–36.0)
MCV: 84.2 fL (ref 78.0–100.0)
PLATELETS: 276 10*3/uL (ref 150–400)
RBC: 4.67 MIL/uL (ref 3.87–5.11)
RDW: 12.8 % (ref 11.5–15.5)
WBC: 9.2 10*3/uL (ref 4.0–10.5)

## 2015-02-21 LAB — PROTIME-INR
INR: 1.5 — ABNORMAL HIGH (ref 0.00–1.49)
PROTHROMBIN TIME: 18.2 s — AB (ref 11.6–15.2)

## 2015-02-21 MED ORDER — RIVAROXABAN (XARELTO) VTE STARTER PACK (15 & 20 MG): ORAL_TABLET | ORAL | Status: DC

## 2015-02-21 NOTE — Discharge Summary (Signed)
Physician Discharge Summary  Regina Koch ZOX:[id number] DOB: Sep 17, 1990 DOA: 02/18/2015  PCP: No primary care provider on file.  Admit date: 02/18/2015 Discharge date: 02/21/2015  Time spent: 40 minutes  Recommendations for Outpatient Follow-up:  1. Follow-up with primary care physician within a week.  Discharge Diagnoses:  Principal Problem:   Bilateral pulmonary embolism (HCC) Active Problems:   Morbid obesity (HCC)   On oral contraceptive pills for non-contraception indication   Discharge Condition: Stable  Diet recommendation: Heart healthy  Filed Weights   02/18/15 2317 02/19/15 0016  Weight: 193.913 kg (427 lb 8 oz) 193.913 kg (427 lb 8 oz)    History of present illness:  Regina Koch is a 24 y.o. female with a past medical history of morbid obesity who comes to the ER due to shortness of breath for 2 days associated with pleuritic chest pain, hemoptysis. Per patient, on Wednesday evening she had sudden chest pain, associated with mild dyspnea and fatigue. She took some over-the-counter analgesics, but could not sleep very well. Next day she noticed some hemoptysis, and she called her doctor and was prescribed Flexeril, which did not help enough with the pain. She denies any long air or motor vehicle travel. She denies any long sitting periods. She denies any previous history of thromboembolism or family history of it. She uses birth control pills, but denies cigarette smoking.  She denies dizziness, diaphoresis, palpitations, orthopnea, PND or pitting edema of the lower extremities. She complains of mild nausea, but denies emesis, diarrhea, constipation, melena, hematochezia. She denies urinary symptoms.  When seen in the emergency department, the patient was in no acute distress. She stated that her pain was better, but still at about 5/10 in intensity. She is stated that she was breathing better with the supplemental oxygen.  Hospital Course:   Principal Problem:   Bilateral pulmonary embolism (HCC) / On oral contraceptive pills for non-contraception indication / Pleuritic chest pain  - Presented to the hospital with shortness of breath and pleuritic chest pain. - CT angio chest showed bilateral pulmonary emboli. No evidence of right heart strain. - This is likely provoked PE secondary to OCPs and morbid obesity. - She is on Lovenox and coumadin - Decision was made to discharge her on Xarelto, coupon given to the patient for 1 free month of Xarelto. - Patient's mother said they will follow-up with her PCP in [place] for further testing and prescription of the Xarelto.  Active Problems:  Morbid obesity (HCC) - Counseled on diet   Procedures:  None  Consultations:  None  Discharge Exam: Filed Vitals:   02/21/15 0436  BP: 132/74  Pulse: 82  Temp: 99.8 F (37.7 C)  Resp: 20   General: Alert and awake, oriented x3, not in any acute distress. HEENT: anicteric sclera, pupils reactive to light and accommodation, EOMI CVS: S1-S2 clear, no murmur rubs or gallops Chest: clear to auscultation bilaterally, no wheezing, rales or rhonchi Abdomen: soft nontender, nondistended, normal bowel sounds, no organomegaly Extremities: no cyanosis, clubbing or edema noted bilaterally Neuro: Cranial nerves II-XII intact, no focal neurological deficits  Discharge Instructions   Discharge Instructions    Increase activity slowly    Complete by:  As directed           Current Discharge Medication List    START taking these medications   Details  Rivaroxaban (XARELTO STARTER PACK) 15 & 20 MG TBPK Take as directed on package: Start with one  tablet by mouth twice a day with  food. On Day 22, switch to one 20mg  tablet once a day with food. Qty: 51 each, Refills: 0      CONTINUE these medications which have NOT CHANGED   Details  cyclobenzaprine (FLEXERIL) 5 MG tablet Take 5 mg by mouth 3 (three) times daily as needed for muscle spasms.     ibuprofen (ADVIL,MOTRIN) 800 MG tablet Take 800 mg by mouth every 8 (eight) hours as needed for fever, headache, mild pain, moderate pain or cramping.    levonorgestrel-ethinyl estradiol (LEVORA 0.15/30, 28,) 0.15-30 MG-MCG tablet Take 1 tablet by mouth daily.    naproxen (NAPROSYN) 500 MG tablet Take 500 mg by mouth 2 (two) times daily.       No Known Allergies    The results of significant diagnostics from this hospitalization (including imaging, microbiology, ancillary and laboratory) are listed below for reference.    Significant Diagnostic Studies: Dg Chest 2 View  02/18/2015   CLINICAL DATA:  Midsternal chest pain with cough.  EXAM: CHEST  2 VIEW  COMPARISON:  None.  FINDINGS: There is no focal parenchymal opacity. There is no pleural effusion or pneumothorax. There is cardiomegaly.  The osseous structures are unremarkable.  IMPRESSION: Cardiomegaly.  Otherwise, no acute cardiopulmonary disease.   Electronically Signed   By: Elige Ko   On: 02/18/2015 16:01   Ct Angio Chest Pe W/cm &/or Wo Cm  02/18/2015   CLINICAL DATA:  Short of breath with chest pain and hemoptysis today.  EXAM: CT ANGIOGRAPHY CHEST WITH CONTRAST  TECHNIQUE: Multidetector CT imaging of the chest was performed using the standard protocol during bolus administration of intravenous contrast. Multiplanar CT image reconstructions and MIPs were obtained to evaluate the vascular anatomy.  CONTRAST:  OMNIPAQUE IOHEXOL 350 MG/ML SOLN  COMPARISON:  Chest radiography same day  FINDINGS: The examination is quite limited because of the patient's large size and poor bolus timing. Nonetheless, there is definite pulmonary embolism with emboli visible in the main pulmonary artery bifurcation on the left and in the right lower lobe pulmonary arterial tree. Difficulty measure the right ventricular to left ventricular ratio, but I do not think there is evidence of right heart strain. There is patchy atelectasis in both lower lobes  related to the emboli. No pleural or pericardial fluid. No spinal abnormality.  Review of the MIP images confirms the above findings.  IMPRESSION: Limited quality study, but definitely positive for bilateral pulmonary emboli. I do not think the study shows evidence of right heart strain.  These results were called by telephone at the time of interpretation on 02/18/2015 at 10:00 pm to Dr. Dierdre Forth , who verbally acknowledged these results.   Electronically Signed   By: Paulina Fusi M.D.   On: 02/18/2015 22:02    Microbiology: No results found for this or any previous visit (from the past 240 hour(s)).   Labs: Basic Metabolic Panel:  Recent Labs Lab 02/18/15 1743 02/19/15 0535  NA 138 138  K 4.2 3.7  CL 106 105  CO2 25 25  GLUCOSE 97 109*  BUN 12 14  CREATININE 0.76 0.82  CALCIUM 9.6 9.2   Liver Function Tests:  Recent Labs Lab 02/18/15 1743 02/19/15 0535  AST 23 16  ALT 17 15  ALKPHOS 56 51  BILITOT 0.9 0.7  PROT 8.5* 7.8  ALBUMIN 3.9 3.4*    Recent Labs Lab 02/18/15 1743  LIPASE 24   No results for input(s): AMMONIA in the last 168 hours. CBC:  Recent  Labs Lab 02/18/15 1743 02/19/15 0535 02/21/15 0426  WBC 9.9 9.0 9.2  NEUTROABS 5.6  --   --   HGB 13.6 12.4 12.6  HCT 40.2 39.5 39.3  MCV 82.9 85.1 84.2  PLT 243 237 276   Cardiac Enzymes: No results for input(s): CKTOTAL, CKMB, CKMBINDEX, TROPONINI in the last 168 hours. BNP: BNP (last 3 results)  Recent Labs  02/18/15 2318  BNP 25.3    ProBNP (last 3 results) No results for input(s): PROBNP in the last 8760 hours.  CBG: No results for input(s): GLUCAP in the last 168 hours.     Signed:  Gwendlyn Hanback A  [facility] 02/21/2015, 12:48 PM

## 2015-02-21 NOTE — Progress Notes (Signed)
Patient discharged home with mother, discharge instructions given and explained to patient/mother and they verbalized understanding. Denies any pain/distress. Accompanied home by family. Skin intact. No wound noted.

## 2015-02-21 NOTE — Progress Notes (Addendum)
ANTICOAGULATION CONSULT NOTE   Pharmacy Consult for Xarelto Indication: new bilateral pulmonary embolus  No Known Allergies  Patient Measurements: Height:  (165.1 cm) Weight: (!) 427 lb 8 oz (193.913 kg) IBW/kg (Calculated) : 57  Vital Signs: Temp: 99.8 F (37.7 C) (10/10 0436) Temp Source: Oral (10/10 0436) BP: 132/74 mmHg (10/10 0436) Pulse Rate: 82 (10/10 0436)  Labs:  Recent Labs  02/18/15 1743 02/19/15 0535 02/19/15 1525 02/20/15 0505 02/21/15 0426  HGB 13.6 12.4  --   --  12.6  HCT 40.2 39.5  --   --  39.3  PLT 243 237  --   --  276  LABPROT  --   --  15.3* 15.2 18.2*  INR  --   --  1.19 1.19 1.50*  CREATININE 0.76 0.82  --   --   --     Estimated Creatinine Clearance: 188.3 mL/min (by C-G formula based on Cr of 0.82).  Assessment: Patient is a 24 y.o obese  F on birth control pills PTA who presented to the ED on 10/7 with c/o CP and hemoptysis.  Chest CTA showed bilateral PEs. LE doppler on 10/8 was negative for DVT. She's currently on lovenox and coumadin for new PE.  MD wants to transition her to Xarelto starter pack for discharge today.  Goal of Therapy:  VTE Treatment   Plan:  1.  Xarelto 15 mg BID x 21 days then on 10/31, start 20 mg daily.  Since patient received Lovenox dose this morning, instructed patient to start Xarelto 15 mg this evening. 2.  Provided Xarelto education and discount card to patient and mother.  Clance Boll, PharmD, BCPS Pager: [phone] 02/21/2015 12:39 PM

## 2015-02-22 LAB — HOMOCYSTEINE: HOMOCYSTEINE-NORM: 8.1 umol/L (ref 0.0–15.0)

## 2015-02-25 LAB — DRVVT MIX: DRVVT MIX: 51.6 s — AB (ref 0.0–45.4)

## 2015-02-25 LAB — CARDIOLIPIN ANTIBODIES, IGG, IGM, IGA
ANTICARDIOLIPIN IGG: 10 GPL U/mL (ref 0–14)
Anticardiolipin IgA: <9 APL U/mL (ref 0–11)

## 2015-02-25 LAB — FACTOR 5 LEIDEN

## 2015-02-25 LAB — PROTEIN S, TOTAL: PROTEIN S AG TOTAL: 95 % (ref 58–150)

## 2015-02-25 LAB — PROTEIN C, TOTAL: PROTEIN C, TOTAL: 106 % (ref 70–140)

## 2015-02-25 LAB — BETA-2-GLYCOPROTEIN I ABS, IGG/M/A
Beta-2 Glyco I IgG: <9 GPI IgG units (ref 0–20)
Beta-2-Glycoprotein I IgA: <9 GPI IgA units (ref 0–25)
Beta-2-Glycoprotein I IgM: <9 GPI IgM units (ref 0–32)

## 2015-02-25 LAB — PROTEIN S ACTIVITY: PROTEIN S ACTIVITY: 92 % (ref 60–145)

## 2015-02-25 LAB — LUPUS ANTICOAGULANT PANEL
DRVVT: 70.2 s — ABNORMAL HIGH (ref 0.0–55.1)
PTT Lupus Anticoagulant: 39.3 s (ref 0.0–50.0)

## 2015-02-25 LAB — PROTEIN C ACTIVITY: Protein C Activity: 137 % (ref 74–151)

## 2015-02-25 LAB — PROTHROMBIN GENE MUTATION

## 2015-02-25 LAB — DRVVT CONFIRM: dRVVT Confirm: 1.6 ratio — ABNORMAL HIGH (ref 0.0–1.4)

## 2015-03-01 ENCOUNTER — Ambulatory Visit: Payer: PRIVATE HEALTH INSURANCE | Attending: Family Medicine | Admitting: Family Medicine

## 2015-03-01 ENCOUNTER — Telehealth: Payer: Self-pay | Admitting: Oncology

## 2015-03-01 ENCOUNTER — Encounter: Payer: Self-pay | Admitting: Family Medicine

## 2015-03-01 VITALS — BP 134/79 | HR 84 | Temp 98.3°F | Resp 18 | Ht 65.0 in | Wt >= 6400 oz

## 2015-03-01 DIAGNOSIS — R790 Abnormal level of blood mineral: Secondary | ICD-10-CM

## 2015-03-01 DIAGNOSIS — I2699 Other pulmonary embolism without acute cor pulmonale: Secondary | ICD-10-CM

## 2015-03-01 DIAGNOSIS — R760 Raised antibody titer: Secondary | ICD-10-CM

## 2015-03-01 MED ORDER — RIVAROXABAN 20 MG PO TABS: 20.0000 mg | ORAL_TABLET | Freq: Every day | ORAL | Status: DC

## 2015-03-01 NOTE — Patient Instructions (Signed)

## 2015-03-01 NOTE — Progress Notes (Signed)
Pt's here for f/up from ED visit for Pulmonary embolism. Pt states that's she feeling okay and denies pain today.  Pt's here to esc.care with PCP.

## 2015-03-01 NOTE — Progress Notes (Addendum)
CC: follow-up from hospitalization.  HPI: Regina Koch is a 24 y.o. female recently hospitalized at [facility] from 02/18/15-02/21/15 for newly diagnosed bilateral pulmonary embolism. She had presented to the ED with chest pain, dyspnea and fatigue and associated hemoptysis; medical history was significant for oral contraceptive use. CT angiogram of the chest revealed bilateral pulmonary embolism without right heart strain and she was commenced on Lovenox and Coumadin which was later switched to Xarelto. Hypercoagulable blood work was sent off which returned negative except for Lupus anticoagulant screen which was positive  Today she reports doing well; she is currently Schooling here in [place] but originally lived in [place]. Currently compliant with her Xarelto. She has discontinued the oral contraceptive she was using and has nothing to use in its place Patient has No headache, No chest pain, No abdominal pain - No Nausea, No new weakness tingling or numbness, No Cough - SOB.  No Known Allergies History reviewed. No pertinent past medical history. Current Outpatient Prescriptions on File Prior to Visit  Medication Sig Dispense Refill  . Rivaroxaban (XARELTO STARTER PACK) 15 & 20 MG TBPK Take as directed on package: Start with one  tablet by mouth twice a day with food. On Day 22, switch to one  tablet once a day with food. 51 each 0  . cyclobenzaprine (FLEXERIL) 5 MG tablet Take 5 mg by mouth 3 (three) times daily as needed for muscle spasms.    Marland Kitchen ibuprofen (ADVIL,MOTRIN) 800 MG tablet Take 800 mg by mouth every 8 (eight) hours as needed for fever, headache, mild pain, moderate pain or cramping.    Marland Kitchen levonorgestrel-ethinyl estradiol (LEVORA 0.15/30, 28,) 0.15-30 MG-MCG tablet Take 1 tablet by mouth daily.    . naproxen (NAPROSYN) 500 MG tablet Take 500 mg by mouth 2 (two) times daily.     No current facility-administered medications on file prior to visit.    History reviewed. No pertinent family history. Social History   Social History  . Marital Status: Single    Spouse Name: N/A  . Number of Children: N/A  . Years of Education: N/A   Occupational History  . Not on file.   Social History Main Topics  . Smoking status: Never Smoker   . Smokeless tobacco: Not on file  . Alcohol Use: Yes     Comment: occasionally  . Drug Use: No  . Sexual Activity: Yes   Other Topics Concern  . Not on file   Social History Narrative    Review of Systems: Constitutional: Negative for fever, chills, diaphoresis, activity change, appetite change and fatigue. HENT: Negative for ear pain, nosebleeds, congestion, facial swelling, rhinorrhea, neck pain, neck stiffness and ear discharge.  Eyes: Negative for pain, discharge, redness, itching and visual disturbance. Respiratory: Negative for cough, choking, chest tightness, shortness of breath, wheezing and stridor.  Cardiovascular: Negative for chest pain, palpitations and leg swelling. Gastrointestinal: Negative for abdominal distention. Genitourinary: Negative for dysuria, urgency, frequency, hematuria, flank pain, decreased urine volume, difficulty urinating and dyspareunia.  Musculoskeletal: Negative for back pain, joint swelling, arthralgias and gait problem. Neurological: Negative for dizziness, tremors, seizures, syncope, facial asymmetry, speech difficulty, weakness, light-headedness, numbness and headaches.  Hematological: Negative for adenopathy. Does not bruise/bleed easily. Psychiatric/Behavioral: Negative for hallucinations, behavioral problems, confusion, dysphoric mood, decreased concentration and agitation.    Objective:    Physical Exam: Constitutional: Patient is morbidly obese appears well-developed and well-nourished. No distress. HENT: Normocephalic, atraumatic, External right and left ear normal. Oropharynx is clear  and moist.  Eyes: Conjunctivae and EOM are normal. PERRLA, no  scleral icterus. Neck: Normal ROM. Neck supple. No JVD. No tracheal deviation. No thyromegaly. CVS: RRR, S1/S2 +, no murmurs, no gallops, no carotid bruit.  Pulmonary: Effort and breath sounds normal, no stridor, rhonchi, wheezes, rales.  Abdominal: Soft. BS +,  no distension, tenderness, rebound or guarding.  Musculoskeletal: Normal range of motion. No edema and no tenderness.  Lymphadenopathy: No lymphadenopathy noted, cervical, inguinal or axillary Neuro: Alert. Normal reflexes, muscle tone coordination. No cranial nerve deficit. Skin: Skin is warm and dry. No rash noted. Not diaphoretic. No erythema. No pallor. Psychiatric: Normal mood and affect. Behavior, judgment, thought content normal.  Lab Results  Component Value Date   WBC 9.2 02/21/2015   HGB 12.6 02/21/2015   HCT 39.3 02/21/2015   MCV 84.2 02/21/2015   PLT 276 02/21/2015   Lab Results  Component Value Date   CREATININE 0.82 02/19/2015   BUN 14 02/19/2015   NA 138 02/19/2015   K 3.7 02/19/2015   CL 105 02/19/2015   CO2 25 02/19/2015   CLINICAL DATA: Short of breath with chest pain and hemoptysis today.  EXAM: CT ANGIOGRAPHY CHEST WITH CONTRAST  TECHNIQUE: Multidetector CT imaging of the chest was performed using the standard protocol during bolus administration of intravenous contrast. Multiplanar CT image reconstructions and MIPs were obtained to evaluate the vascular anatomy.  CONTRAST: 100mL OMNIPAQUE IOHEXOL 350 MG/ML SOLN  COMPARISON: Chest radiography same day  FINDINGS: The examination is quite limited because of the patient's large size and poor bolus timing. Nonetheless, there is definite pulmonary embolism with emboli visible in the main pulmonary artery bifurcation on the left and in the right lower lobe pulmonary arterial tree. Difficulty measure the right ventricular to left ventricular ratio, but I do not think there is evidence of right heart strain. There is patchy atelectasis in  both lower lobes related to the emboli. No pleural or pericardial fluid. No spinal abnormality.  Review of the MIP images confirms the above findings.  IMPRESSION: Limited quality study, but definitely positive for bilateral pulmonary emboli. I do not think the study shows evidence of right heart strain.  These results were called by telephone at the time of interpretation on 02/18/2015 at 10:00 pm to Dr. Dierdre ForthHANNAH MUTHERSBAUGH , who verbally acknowledged these results.   Electronically Signed  By: Paulina FusiMark Shogry M.D.  On: 02/18/2015 22:02     Assessment and plan:   24 year old female with recently diagnosed bilateral pulmonary embolism with  provoking factors including oral contraceptive use as well as morbid obesity  Bilateral PE: Refill Xarelto prescription Advised on nonhormonal contraception and she is reluctant to use an IUD. She does have a gynecologist whom she will be seeing next month and I have informed her to use other forms of contraception in the mean time. Lupus anticoagulation screen is positive and given her history of stroke and a cousin and questionable blood clot I am referring her to hematology to determine the duration of anticoagulation.  Obesity: Discussed increasing physical activity, cutting portion sizes. She has agreed to walking 30 minutes every day for 5 days a week.  Jaclyn ShaggyEnobong, Amao, MD. [facility] [phone] 03/01/2015, 10:17 AM

## 2015-03-01 NOTE — Telephone Encounter (Signed)
new patient appt-s/w patient and gave np appt for 11/03 @ 10:30 w/Dr. Clelia CroftShadad Referring Dr. Venetia NightAmao Dx- PE

## 2015-03-17 ENCOUNTER — Telehealth: Payer: Self-pay | Admitting: Oncology

## 2015-03-17 ENCOUNTER — Ambulatory Visit (HOSPITAL_BASED_OUTPATIENT_CLINIC_OR_DEPARTMENT_OTHER): Payer: PRIVATE HEALTH INSURANCE | Admitting: Oncology

## 2015-03-17 VITALS — BP 125/92 | HR 96 | Temp 99.5°F | Resp 20 | Ht 65.0 in | Wt >= 6400 oz

## 2015-03-17 DIAGNOSIS — Z7901 Long term (current) use of anticoagulants: Secondary | ICD-10-CM | POA: Diagnosis not present

## 2015-03-17 DIAGNOSIS — I2699 Other pulmonary embolism without acute cor pulmonale: Secondary | ICD-10-CM | POA: Diagnosis not present

## 2015-03-17 NOTE — Consult Note (Signed)
Reason for Referral: Pulmonary embolism.   HPI: 24 year old woman native of [place] and currently lives in this area for the last 3 years. He is a pleasant woman with history of obesity and on oral contraceptives presented with symptoms of chest pain and difficulty breathing as well as cough. She also reported a pleuritic chest pain as well as hemoptysis associated with it. Her evaluation in the emergency department on 02/19/2015 included a CT scan of the chest which showed bilateral pulmonary emboli. Lower extremity Dopplers did not show any evidence of deep vein thrombosis. She was hospitalized briefly started on intravenous heparin and transitioned to warfarin. She subsequently was changed to Xarelto upon discharge. She has been on this medications for the last month. She reported no complications associated with it. She had a hypercoagulable workup done which was negative except for a positive lupus anticoagulant. Since her discharge, she reports no complaints. She has not reported any hemoptysis or shortness of breath. Has not reported any lower extremity edema. She does not report any previous thrombosis episodes. She denied any recent surgery or travel. She really does not have any strong family history of thrombosis. No family history of miscarriages or pregnancy complications.  She does not report any headaches, blurry vision, syncope or seizures. She does not report any fevers or chills or sweats. He does not report any cough or hemoptysis. Does not report any nausea, vomiting or abdominal pain. Does not report any frequency urgency or hesitancy. She does not report any musculoskeletal complaints. Remaining review of systems unremarkable.   Her past medical history is really unremarkable for hypertension, diabetes or coronary disease. She has no pregnancy history.     Current outpatient prescriptions:  .  Rivaroxaban (XARELTO STARTER PACK) 15 & 20 MG TBPK, Take as directed on package: Start  with one  tablet by mouth twice a day with food. On Day 22, switch to one  tablet once a day with food., Disp: 51 each, Rfl: 0 .  rivaroxaban (XARELTO) 20 MG TABS tablet, Take 1 tablet (20 mg total) by mouth daily with supper. (Patient not taking: Reported on 03/17/2015), Disp: 90 tablet, Rfl: 3:  No Known Allergies:  No family history of thrombosis or malignancy. No history of obstetrical complications.   Social History   Social History  . Marital Status: Single    Spouse Name: N/A  . Number of Children: N/A  . Years of Education: N/A   Occupational History  . Not on file.   Social History Main Topics  . Smoking status: Never Smoker   . Smokeless tobacco: Not on file  . Alcohol Use: Yes     Comment: occasionally  . Drug Use: No  . Sexual Activity: Yes   Other Topics Concern  . Not on file   Social History Narrative  :  Pertinent items are noted in HPI.  Exam: ECOG 0 Blood pressure 125/92, pulse 96, temperature 99.5 F (37.5 C), temperature source Oral, resp. rate 20, height  (1.651 m), weight 429 lb 8 oz (194.82 kg), last menstrual period 02/16/2015, SpO2 99 %. General appearance: alert and cooperative Head: Normocephalic, without obvious abnormality Throat: lips, mucosa, and tongue normal; teeth and gums normal Neck: no adenopathy Back: negative Resp: clear to auscultation bilaterally Chest wall: no tenderness Cardio: regular rate and rhythm, S1, S2 normal, no murmur, click, rub or gallop GI: soft, non-tender; bowel sounds normal; no masses,  no organomegaly Extremities: extremities normal, atraumatic, no cyanosis or edema Pulses: 2+  and symmetric Skin: Skin color, texture, turgor normal. No rashes or lesions Lymph nodes: Cervical, supraclavicular, and axillary nodes normal.  CBC    Component Value Date/Time   WBC 9.2 02/21/2015 0426   RBC 4.67 02/21/2015 0426   HGB 12.6 02/21/2015 0426   HCT 39.3 02/21/2015 0426   PLT 276 02/21/2015 0426    MCV 84.2 02/21/2015 0426   MCH 27.0 02/21/2015 0426   MCHC 32.1 02/21/2015 0426   RDW 12.8 02/21/2015 0426   LYMPHSABS 3.4 02/18/2015 1743   MONOABS 0.8 02/18/2015 1743   EOSABS 0.1 02/18/2015 1743   BASOSABS 0.0 02/18/2015 1743      Chemistry      Component Value Date/Time   NA 138 02/19/2015 0535   K 3.7 02/19/2015 0535   CL 105 02/19/2015 0535   CO2 25 02/19/2015 0535   BUN 14 02/19/2015 0535   CREATININE 0.82 02/19/2015 0535      Component Value Date/Time   CALCIUM 9.2 02/19/2015 0535   ALKPHOS 51 02/19/2015 0535   AST 16 02/19/2015 0535   ALT 15 02/19/2015 0535   BILITOT 0.7 02/19/2015 0535       Dg Chest 2 View  02/18/2015  CLINICAL DATA:  Midsternal chest pain with cough. EXAM: CHEST  2 VIEW COMPARISON:  None. FINDINGS: There is no focal parenchymal opacity. There is no pleural effusion or pneumothorax. There is cardiomegaly. The osseous structures are unremarkable. IMPRESSION: Cardiomegaly.  Otherwise, no acute cardiopulmonary disease. Electronically Signed   By: Elige KoHetal  Patel   On: 02/18/2015 16:01   Ct Angio Chest Pe W/cm &/or Wo Cm  02/18/2015  CLINICAL DATA:  Short of breath with chest pain and hemoptysis today. EXAM: CT ANGIOGRAPHY CHEST WITH CONTRAST TECHNIQUE: Multidetector CT imaging of the chest was performed using the standard protocol during bolus administration of intravenous contrast. Multiplanar CT image reconstructions and MIPs were obtained to evaluate the vascular anatomy. CONTRAST:  100mL OMNIPAQUE IOHEXOL 350 MG/ML SOLN COMPARISON:  Chest radiography same day FINDINGS: The examination is quite limited because of the patient's large size and poor bolus timing. Nonetheless, there is definite pulmonary embolism with emboli visible in the main pulmonary artery bifurcation on the left and in the right lower lobe pulmonary arterial tree. Difficulty measure the right ventricular to left ventricular ratio, but I do not think there is evidence of right heart strain.  There is patchy atelectasis in both lower lobes related to the emboli. No pleural or pericardial fluid. No spinal abnormality. Review of the MIP images confirms the above findings. IMPRESSION: Limited quality study, but definitely positive for bilateral pulmonary emboli. I do not think the study shows evidence of right heart strain. These results were called by telephone at the time of interpretation on 02/18/2015 at 10:00 pm to Dr. Dierdre ForthHANNAH MUTHERSBAUGH , who verbally acknowledged these results. Electronically Signed   By: Paulina FusiMark  Shogry M.D.   On: 02/18/2015 22:02    Assessment and Plan:   24 year old woman with the following issues:  1. Bilateral pulmonary emboli diagnosed in October 2016. She presented with chest pain and hemoptysis and had a CT scan that confirmed these findings. Her hypercoagulable workup did reveal a positive lupus anticoagulant but otherwise unremarkable. Provoking symptoms include obesity as well as oral contraceptives. The natural history of acute thrombosis was discussed with the patient today. Management options were also reviewed.  I have recommended full dose anticoagulation with Xarelto for at least 6 months. At that time I will repeat her lupus  anticoagulant. If her lupus anticoagulant is no longer detected, no further anticoagulation will be needed beyond 6 months. If she continuously have positive lupus anticoagulant, longer anticoagulation will be needed. It is very possible that her lupus anticoagulant testing was a false-positive but certainly a true positive cannot be ruled out.  As long as she has a lupus anticoagulant positive testing, she will need to be on full dose anticoagulation. If Xarelto's cost becomes an issue for her, then warfarin would be a reasonable alternative.  The plan is to repeat lupus anticoagulant in April 2017 and she'll follow-up in 1-2 weeks after that and I'll give my final recommendation at that time.  2. Birth control: I recommended to use  not hormonal-based contraception and to avoid estrogen and progesterone-based contraception at this time. I have recommended establishing care with gynecology for further discussion regarding options of contraception.  3. Follow-up: Will be in 6 months to give her my final recommendations.

## 2015-03-17 NOTE — Telephone Encounter (Signed)
per pof to sch pt appt-gave pt copy of avs °

## 2015-03-17 NOTE — Progress Notes (Signed)
Please see consult note.  

## 2015-09-07 ENCOUNTER — Other Ambulatory Visit (HOSPITAL_BASED_OUTPATIENT_CLINIC_OR_DEPARTMENT_OTHER): Payer: PRIVATE HEALTH INSURANCE

## 2015-09-07 ENCOUNTER — Telehealth: Payer: Self-pay | Admitting: *Deleted

## 2015-09-07 DIAGNOSIS — I2699 Other pulmonary embolism without acute cor pulmonale: Secondary | ICD-10-CM

## 2015-09-07 NOTE — Telephone Encounter (Signed)
"  I have a test today (Lupus anticoagulant testing) to determine next steps of my Xarelto therapy.  I eat when I take the pill.  Can I eat today before this test?"  Advised she may eat before today's lab work.  No further questions.

## 2015-09-09 LAB — LUPUS ANTICOAGULANT PANEL
DRVVT CONFIRM: 1.9 ratio — AB (ref 0.8–1.2)
DRVVT MIX: 82.5 s — AB (ref 0.0–44.0)
DRVVT: 134.4 s — AB (ref 0.0–44.0)
HEXAGONAL PHASE PHOSPHOLIPID: 4 s (ref 0–11)
PTT-LA Mix: 44.6 s — ABNORMAL HIGH (ref 0.0–40.6)
PTT-LA: 50.6 s — ABNORMAL HIGH (ref 0.0–43.6)

## 2015-09-21 ENCOUNTER — Ambulatory Visit (HOSPITAL_BASED_OUTPATIENT_CLINIC_OR_DEPARTMENT_OTHER): Payer: PRIVATE HEALTH INSURANCE | Admitting: Oncology

## 2015-09-21 VITALS — BP 130/68 | HR 78 | Temp 98.7°F | Resp 19 | Ht 65.0 in | Wt >= 6400 oz

## 2015-09-21 DIAGNOSIS — I2699 Other pulmonary embolism without acute cor pulmonale: Secondary | ICD-10-CM

## 2015-09-21 NOTE — Progress Notes (Signed)
Hematology and Oncology Follow Up Visit  Regina Koch [id number] 1991/05/02 25 y.o. 09/21/2015 10:26 AM No PCP Per PatientNo ref. provider found   Principle Diagnosis: 25 year old woman with bilateral pulmonary emboli diagnosed in October 2016. This was provoked by oral contraceptives and she was found to have a positive lupus anticoagulant.   Prior Therapy: She was treated briefly with heparin and subsequently warfarin during her initial hospitalization in October 2016.  Current therapy: Xarelto at 20 mg daily since October 2016.  Interim History: Regina Koch presents today for a follow-up visit. Since the last visit, she have tolerated Xarelto without any complications. She had no problems with adherence or excessive bleeding. She did have slight increase in the clots and her menses but no other complaints. She have discontinued all hormone base contraception. She feels relatively fair without any respiratory complaints. He denied any lower extremity swelling or thrombosis.  She does not report any headaches, blurry vision, syncope or seizures. She does not report any fevers or chills or sweats. He does not report any cough or hemoptysis. Does not report any nausea, vomiting or abdominal pain. Does not report any frequency urgency or hesitancy. She does not report any musculoskeletal complaints. Remaining review of systems unremarkable  Medications: I have reviewed the patient's current medications.  Current Outpatient Prescriptions  Medication Sig Dispense Refill  . rivaroxaban (XARELTO) 20 MG TABS tablet Take 1 tablet (20 mg total) by mouth daily with supper. 90 tablet 3  . Rivaroxaban (XARELTO STARTER PACK) 15 & 20 MG TBPK Take as directed on package: Start with one  tablet by mouth twice a day with food. On Day 22, switch to one  tablet once a day with food. (Patient not taking: Reported on 09/21/2015) 51 each 0   No current facility-administered medications for this visit.      Allergies: No Known Allergies  Past Medical History, Surgical history, Social history, and Family History were reviewed and updated.   Physical Exam: Blood pressure 130/68, pulse 78, temperature 98.7 F (37.1 C), temperature source Oral, resp. rate 19, height  (1.651 m), weight 442 lb 3.2 oz (200.581 kg), SpO2 99 %. ECOG: 0 General appearance: alert and cooperative Head: Normocephalic, without obvious abnormality Neck: no adenopathy Lymph nodes: Cervical, supraclavicular, and axillary nodes normal. Heart:regular rate and rhythm, S1, S2 normal, no murmur, click, rub or gallop Lung:chest clear, no wheezing, rales, normal symmetric air entry Abdomin: soft, non-tender, without masses or organomegaly EXT:no erythema, induration, or nodules   Lab Results: Lab Results  Component Value Date   WBC 9.2 02/21/2015   HGB 12.6 02/21/2015   HCT 39.3 02/21/2015   MCV 84.2 02/21/2015   PLT 276 02/21/2015     Chemistry      Component Value Date/Time   NA 138 02/19/2015 0535   K 3.7 02/19/2015 0535   CL 105 02/19/2015 0535   CO2 25 02/19/2015 0535   BUN 14 02/19/2015 0535   CREATININE 0.82 02/19/2015 0535      Component Value Date/Time   CALCIUM 9.2 02/19/2015 0535   ALKPHOS 51 02/19/2015 0535   AST 16 02/19/2015 0535   ALT 15 02/19/2015 0535   BILITOT 0.7 02/19/2015 0535       Impression and Plan:   25 year old woman with the following issues:  1.Bilateral pulmonary emboli diagnosed in October 2016. She presented with chest pain and hemoptysis and had a CT scan that confirmed these findings. Her hypercoagulable workup did reveal a positive lupus anticoagulant but  otherwise unremarkable. Provoking symptoms include obesity as well as oral contraceptives.   She has been on Xarelto since October 2016. Repeat lupus anticoagulant in April 2017 continued to show positive screen.  The risks and benefits of continuing anticoagulation at this point was reviewed. I feel that  given her persistent positive lupus anticoagulant, the nature of her original clot which is a bilateral pulmonary emboli that are life-threatening and her morbid obesity but high risk of recurrent thrombosis. The risks of continuing Xarelto were also reviewed including additional cost, side effects associated with this medication as well as bleeding risk.  After discussion today, the risks of thrombosis recurrence with a and the potential risk associated with continuing Xarelto. I recommended continuing Xarelto for the time being and discontinuation in early October 2017. I recommended repeat lupus anticoagulant at that time off this medication to ensure no interaction between Xarelto and the lupus anticoagulant assay. If she continues to have persistent positive lupus anticoagulant, indefinite anticoagulation might be my next recommendation.  Therefore lupus anticoagulant testing is negative in October 2017, we'll discuss discontinuation of anticoagulation at that time.  2. Follow-up: Will be in November 2017 after repeat testing.  SHADAD,FIRAS, MD 5/10/201710:26 AM

## 2015-12-14 ENCOUNTER — Encounter (HOSPITAL_COMMUNITY): Payer: Self-pay

## 2015-12-14 ENCOUNTER — Emergency Department (HOSPITAL_COMMUNITY)
Admission: EM | Admit: 2015-12-14 | Discharge: 2015-12-14 | Disposition: A | Discharge status: Home / Self Care | Payer: PRIVATE HEALTH INSURANCE | Attending: Emergency Medicine | Admitting: Emergency Medicine

## 2015-12-14 DIAGNOSIS — N939 Abnormal uterine and vaginal bleeding, unspecified: Secondary | ICD-10-CM | POA: Diagnosis present

## 2015-12-14 DIAGNOSIS — R51 Headache: Secondary | ICD-10-CM | POA: Diagnosis not present

## 2015-12-14 HISTORY — DX: Other pulmonary embolism without acute cor pulmonale: I26.99

## 2015-12-14 LAB — COMPREHENSIVE METABOLIC PANEL
ALBUMIN: 3.9 g/dL (ref 3.5–5.0)
ALT: 16 U/L (ref 14–54)
AST: 15 U/L (ref 15–41)
Alkaline Phosphatase: 64 U/L (ref 38–126)
Anion gap: 4 — ABNORMAL LOW (ref 5–15)
BUN: 13 mg/dL (ref 6–20)
CHLORIDE: 106 mmol/L (ref 101–111)
CO2: 25 mmol/L (ref 22–32)
Calcium: 9 mg/dL (ref 8.9–10.3)
Creatinine, Ser: 0.7 mg/dL (ref 0.44–1.00)
GFR calc Af Amer: >60 mL/min (ref >60)
GLUCOSE: 101 mg/dL — AB (ref 65–99)
POTASSIUM: 3.9 mmol/L (ref 3.5–5.1)
SODIUM: 135 mmol/L (ref 135–145)
Total Bilirubin: 0.3 mg/dL (ref 0.3–1.2)
Total Protein: 8.1 g/dL (ref 6.5–8.1)

## 2015-12-14 LAB — CBC WITH DIFFERENTIAL/PLATELET
BASOS ABS: 0 10*3/uL (ref 0.0–0.1)
BASOS PCT: 0 %
EOS PCT: 1 %
Eosinophils Absolute: 0.1 10*3/uL (ref 0.0–0.7)
HCT: 39 % (ref 36.0–46.0)
Hemoglobin: 12.5 g/dL (ref 12.0–15.0)
Lymphocytes Relative: 39 %
Lymphs Abs: 3.1 10*3/uL (ref 0.7–4.0)
MCH: 26 pg (ref 26.0–34.0)
MCHC: 32.1 g/dL (ref 30.0–36.0)
MCV: 81.3 fL (ref 78.0–100.0)
MONO ABS: 0.5 10*3/uL (ref 0.1–1.0)
Monocytes Relative: 6 %
Neutro Abs: 4.2 10*3/uL (ref 1.7–7.7)
Neutrophils Relative %: 54 %
PLATELETS: 264 10*3/uL (ref 150–400)
RBC: 4.8 MIL/uL (ref 3.87–5.11)
RDW: 14.2 % (ref 11.5–15.5)
WBC: 7.9 10*3/uL (ref 4.0–10.5)

## 2015-12-14 LAB — I-STAT BETA HCG BLOOD, ED (MC, WL, AP ONLY)

## 2015-12-14 LAB — PROTIME-INR
INR: 1.63
Prothrombin Time: 19.5 seconds — ABNORMAL HIGH (ref 11.4–15.2)

## 2015-12-14 NOTE — Discharge Instructions (Signed)
Continue taking your home medications as prescribed. I recommend calling the OBGYN office listed above to schedule a follow up appointment in the next week. Please return to the Emergency Department if symptoms worsen or new onset of fever, abdominal pain, worsening vaginal bleeding, shortness of breath, chest pain, lightheadedness, dizziness, syncope.

## 2015-12-14 NOTE — ED Triage Notes (Addendum)
Pt c/o intermittent vaginal bleeding since 7/9 and intermittent headache, nausea, mood swings, breast tenderness, constipation, low back cramping, and lower abdominal pain x 1 week.  Pain score 3/10.  Pt has not taken anything for pain.  Pt concerned d/t taking Xarelto.    Pt reports menstraul cycle started x 2 days ago and all symptoms except low back and generalized abdominal pain resolved.  Sts period is "a little heavier than normal."

## 2015-12-14 NOTE — ED Notes (Signed)
Discharge instructions and follow up care reviewed with patient. Patient verbalized understanding. 

## 2015-12-14 NOTE — ED Provider Notes (Signed)
[facility]-EMERGENCY DEPT Provider Note   CSN: [id number] Arrival date & time: 12/14/15  1346  First Provider Contact:  First MD Initiated Contact with Patient 12/14/15 1403        History   Chief Complaint Chief Complaint  Patient presents with  . Vaginal Bleeding  . Nausea  . Abdominal Pain    HPI Regina Koch is a 25 y.o. female.  Patient is a 25 year old female with past medical history of PE (currently on Xarelto) who presents the ED with complaint of vaginal bleeding, onset 3 days. Patient reports initially she began having intermittent lower abdominal cramping, lower back cramping, breast tenderness and intermittent headaches over the past week. Patient reports the symptoms are common prior to having her period but notes she usually does not have any abdominal pain. She notes 2 days ago she began having vaginal bleeding which she reports is a little heavier than her typical periods. She endorses passing small clots. Patient reports since her vaginal bleeding started her abdominal pain, back pain, headache, breast tenderness have since resolved. Denies fever, chills, visual changes, lightheadedness, dizziness, cough, shortness of breath, chest pain, palpitations, vomiting, diarrhea, urinary symptoms, flank pain, vaginal discharge. Patient reports she has been taking her prescription of Xarelto as prescribed. Denies taking OCPs. LMP week of Jun 9th, reports having irregular periods. Denies hx of abdominal surgeries.       Past Medical History:  Diagnosis Date  . Pulmonary embolism [facility])     Patient Active Problem List   Diagnosis Date Noted  . Morbid obesity (HCC) 02/19/2015  . On oral contraceptive pills for non-contraception indication 02/19/2015  . 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 No pertinent surgical history.  OB History    No data available       Home Medications    Prior to Admission medications   Medication Sig Start Date End  Date Taking? Authorizing Provider  acetaminophen (TYLENOL) 500 MG tablet Take 1,000 mg by mouth every 6 (six) hours as needed for mild pain, moderate pain, fever or headache.   Yes Historical Provider, MD  rivaroxaban (XARELTO) 20 MG TABS tablet Take 1 tablet (20 mg total) by mouth daily with supper. Patient taking differently: Take 20 mg by mouth daily with breakfast.  03/01/15  Yes Jaclyn Shaggy, MD  Rivaroxaban (XARELTO STARTER PACK) 15 & 20 MG TBPK Take as directed on package: Start with one  tablet by mouth twice a day with food. On Day 22, switch to one  tablet once a day with food. Patient not taking: Reported on 09/21/2015 02/21/15   Clydia Llano, MD    Family History History reviewed. No pertinent family history.  Social History Social History  Substance Use Topics  . Smoking status: Never Smoker  . Smokeless tobacco: Never Used  . Alcohol use Yes     Comment: occasionally     Allergies   Review of patient's allergies indicates no known allergies.   Review of Systems Review of Systems  Gastrointestinal: Positive for abdominal pain.  Genitourinary: Positive for vaginal bleeding.  Musculoskeletal: Positive for back pain.  Neurological: Positive for headaches.  All other systems reviewed and are negative.    Physical Exam Updated Vital Signs BP 131/89 (BP Location: Left Arm)   Pulse 72   Temp 98.8 F (37.1 C) (Oral)   Resp 18   LMP 12/12/2015   SpO2 100%   Physical Exam  Constitutional: She is oriented to person, place, and time. She  appears well-developed and well-nourished. No distress.  Morbidly obese female  HENT:  Head: Normocephalic and atraumatic.  Mouth/Throat: Oropharynx is clear and moist. No oropharyngeal exudate.  Eyes: Conjunctivae and EOM are normal. Right eye exhibits no discharge. Left eye exhibits no discharge. No scleral icterus.  Neck: Normal range of motion. Neck supple.  Cardiovascular: Normal rate, regular rhythm, normal heart  sounds and intact distal pulses.   Pulmonary/Chest: Effort normal and breath sounds normal. No respiratory distress. She has no wheezes. She has no rales. She exhibits no tenderness.  Abdominal: Soft. Bowel sounds are normal. She exhibits no distension and no mass. There is no tenderness. There is no rebound and no guarding. No hernia.  No CVA tenderness  Musculoskeletal: Normal range of motion. She exhibits no edema.  Neurological: She is alert and oriented to person, place, and time.  Skin: Skin is warm and dry. She is not diaphoretic.  Nursing note and vitals reviewed.  Pelvic exam: normal external genitalia, vulva, vagina, cervix, uterus and adnexa, VULVA: normal appearing vulva with no masses, tenderness or lesions, VAGINA: normal appearing vagina with normal color and discharge, no lesions, small amount of blood noted in vaginal vault, CERVIX: normal appearing cervix without discharge or lesions, small amount of blood slowly oozing from cervical os, UTERUS: uterus is normal size, shape, consistency and nontender, ADNEXA: normal adnexa in size, nontender and no masses, exam chaperoned by female tech.   ED Treatments / Results  Labs (all labs ordered are listed, but only abnormal results are displayed) Labs Reviewed  COMPREHENSIVE METABOLIC PANEL - Abnormal; Notable for the following:       Result Value   Glucose, Bld 101 (*)    Anion gap 4 (*)    All other components within normal limits  PROTIME-INR - Abnormal; Notable for the following:    Prothrombin Time 19.5 (*)    All other components within normal limits  CBC WITH DIFFERENTIAL/PLATELET  I-STAT BETA HCG BLOOD, ED ([facility], [facility], [facility] ONLY)    EKG  EKG Interpretation None       Radiology No results found.  Procedures Procedures (including critical care time)  Medications Ordered in ED Medications - No data to display   Initial Impression / Assessment and Plan / ED Course  I have reviewed the triage vital signs and the  nursing notes.  Pertinent labs & imaging results that were available during my care of the patient were reviewed by me and considered in my medical decision making (see chart for details).  Clinical Course    Pt presents with vaginal bleeding. Reports having lower abdominal cramping, low back cramping, HA for the past week which resolved with onset of vaginal bleeding. LMP around 6/9, reports hx of irregular menses. Pt is currently on Xarelto for PE which was dx on 02/2015. VSS. Exam unremarkable. Pelvic exam revealed small amount of blood in vaginal vault with small amount of blood slowly oozing from cervical oz, no CMT or adnexal tenderness. Pregnancy negative. Hgb 12.5. INR 1.63. Remaining labs unremarkable. Pt hemodynamically stable in the ED. VSS. Suspect pt's vaginal bleeding is due to her menstrual cycle. Discussed results and plan for d/c with pt. Plan to d/c pt home with OBGYN follow up. Advised pt to continue taking her Xarelto as prescribed. Discussed strict return precautions with pt.   Final Clinical Impressions(s) / ED Diagnoses   Final diagnoses:  Vaginal bleeding    New Prescriptions New Prescriptions   No medications on file  Satira Sark [place], [place] 12/14/15 1612    Shaune Pollack, MD 12/15/15 2056

## 2015-12-14 NOTE — ED Notes (Signed)
PA at bedside.

## 2015-12-14 NOTE — ED Notes (Signed)
RN at bedside starting IV will collect labs 

## 2015-12-14 NOTE — ED Notes (Signed)
Patient provided bloody urine sample (patient on menstrual cycle). PA made aware; PA states to not send sample.

## 2016-02-14 ENCOUNTER — Telehealth: Payer: Self-pay | Admitting: *Deleted

## 2016-02-14 NOTE — Telephone Encounter (Signed)
"  I am calling for appointment information.  I need to know when I am scheduled to come in."  March 13, 2016 at 10:00 for lab.  F/U is March 22, 2016 at 10:30 am.  No further questions.

## 2016-03-13 ENCOUNTER — Other Ambulatory Visit (HOSPITAL_BASED_OUTPATIENT_CLINIC_OR_DEPARTMENT_OTHER): Payer: PRIVATE HEALTH INSURANCE

## 2016-03-13 DIAGNOSIS — I2699 Other pulmonary embolism without acute cor pulmonale: Secondary | ICD-10-CM

## 2016-03-14 LAB — LUPUS ANTICOAGULANT PANEL
PTT-LA: 35.8 s (ref 0.0–51.9)
dRVVT: 41.1 s (ref 0.0–47.0)

## 2016-03-22 ENCOUNTER — Ambulatory Visit (HOSPITAL_BASED_OUTPATIENT_CLINIC_OR_DEPARTMENT_OTHER): Payer: PRIVATE HEALTH INSURANCE | Admitting: Oncology

## 2016-03-22 VITALS — BP 133/75 | HR 78 | Temp 98.4°F | Resp 20 | Ht 65.0 in | Wt >= 6400 oz

## 2016-03-22 DIAGNOSIS — Z86711 Personal history of pulmonary embolism: Secondary | ICD-10-CM | POA: Diagnosis not present

## 2016-03-22 DIAGNOSIS — I2699 Other pulmonary embolism without acute cor pulmonale: Secondary | ICD-10-CM

## 2016-03-22 NOTE — Progress Notes (Signed)
Hematology and Oncology Follow Up Visit  Regina Koch [id number] 12/24/1990 25 y.o. 03/22/2016 10:35 AM No PCP Per PatientNo ref. provider found   Principle Diagnosis: 25 year old woman with bilateral pulmonary emboli diagnosed in October 2016. This was provoked by oral contraceptives and she was found to have a positive lupus anticoagulant. Her repeat lupus anticoagulant have been negative.   Prior Therapy: She was treated briefly with heparin and subsequently warfarin during her initial hospitalization in October 2016.  Current therapy: Xarelto at 20 mg daily since October 2016. Therapy discontinued in October 2017 after completing one year of anticoagulation.  Interim History: Regina Koch presents today for a follow-up visit. Since the last visit, she has been off Xarelto the last month without complications. She feels relatively fair without any respiratory complaints. He denied any lower extremity swelling or thrombosis. She remains ambulatory and attends to activities of daily living. She has not reported any bleeding episodes. Her performance status and activity level is unchanged. She is off oral contraceptives.  She does not report any headaches, blurry vision, syncope or seizures. She does not report any fevers or chills or sweats. He does not report any cough or hemoptysis. Does not report any nausea, vomiting or abdominal pain. Does not report any frequency urgency or hesitancy. She does not report any musculoskeletal complaints. Remaining review of systems unremarkable  Medications: I have reviewed the patient's current medications.  Current Outpatient Prescriptions  Medication Sig Dispense Refill  . acetaminophen (TYLENOL) 500 MG tablet Take 1,000 mg by mouth every 6 (six) hours as needed for mild pain, moderate pain, fever or headache.    . Rivaroxaban (XARELTO STARTER PACK) 15 & 20 MG TBPK Take as directed on package: Start with one 15mg  tablet by mouth twice a day with food. On  Day 22, switch to one 20mg  tablet once a day with food. (Patient not taking: Reported on 09/21/2015) 51 each 0  . rivaroxaban (XARELTO) 20 MG TABS tablet Take 1 tablet (20 mg total) by mouth daily with supper. (Patient taking differently: Take 20 mg by mouth daily with breakfast. ) 90 tablet 3   No current facility-administered medications for this visit.      Allergies: No Known Allergies  Past Medical History, Surgical history, Social history, and Family History were reviewed and updated.   Physical Exam: Blood pressure 133/75, pulse 78, temperature 98.4 F (36.9 C), temperature source Oral, resp. rate 20, height 5\' 5"  (1.651 m), weight (!) 450 lb 11.2 oz (204.4 kg), SpO2 99 %. ECOG: 0 General appearance: alert and cooperative appeared without distress. Head: Normocephalic, without obvious abnormality no oral ulcers or lesions. Neck: no adenopathy Lymph nodes: Cervical, supraclavicular, and axillary nodes normal. Heart:regular rate and rhythm, S1, S2 normal, no murmur, click, rub or gallop Lung:chest clear, no wheezing, rales, normal symmetric air entry Abdomin: soft, non-tender, without masses or organomegaly no shifting dullness or ascites. EXT:no erythema, induration, or nodules   Lab Results: Lab Results  Component Value Date   WBC 7.9 12/14/2015   HGB 12.5 12/14/2015   HCT 39.0 12/14/2015   MCV 81.3 12/14/2015   PLT 264 12/14/2015     Chemistry      Component Value Date/Time   NA 135 12/14/2015 1432   K 3.9 12/14/2015 1432   CL 106 12/14/2015 1432   CO2 25 12/14/2015 1432   BUN 13 12/14/2015 1432   CREATININE 0.70 12/14/2015 1432      Component Value Date/Time   CALCIUM 9.0 12/14/2015 1432  ALKPHOS 64 12/14/2015 1432   AST 15 12/14/2015 1432   ALT 16 12/14/2015 1432   BILITOT 0.3 12/14/2015 1432     Lupus Anticoag Interp  Lupus anticoagulant panel*  Collected: 03/13/16 0935  Resulting lab: [facility]  Value: Comment:  Comment: No lupus anticoagulant  was detected.  *Additional information available - comment, narrative     Impression and Plan:   25 year old woman with the following issues:  1.Bilateral pulmonary emboli diagnosed in October 2016. She presented with chest pain and hemoptysis and had a CT scan that confirmed these findings. Her hypercoagulable workup did reveal a positive lupus anticoagulant but otherwise unremarkable. Provoking symptoms include obesity as well as oral contraceptives.   She has been on Xarelto since October 2016. Repeat lupus anticoagulant in April 2017 continued to show positive screen.  Xarelto was discontinued in October 2017 and a repeat lupus anticoagulant testing on 03/13/2016 showed no evidence of detected lupus anticoagulant.  Based on these findings risks and benefits of continuing Xarelto versus stopping at were reviewed. Her risk of recurrent thrombosis is low or after discontinuation of oral contraceptives and she does not have inherited thrombophilia. She prefers to be off Xarelto at this time however she understands if she develops recurrent thrombosis, long-term anticoagulation will be needed.  I have counseled her about different strategies to alleviate her risk of thrombosis including losing weight, increased mobility and avoid oral contraceptives.  2. Follow-up: Will be as needed.  Nyliah Nierenberg, MD 11/9/201710:35 AM

## 2016-12-07 ENCOUNTER — Other Ambulatory Visit: Payer: Self-pay | Admitting: Orthopedic Surgery

## 2016-12-07 ENCOUNTER — Ambulatory Visit
Admission: RE | Admit: 2016-12-07 | Discharge: 2016-12-07 | Disposition: A | Discharge status: Home / Self Care | Payer: Managed Care, Other (non HMO) | Source: Ambulatory Visit | Attending: Orthopedic Surgery | Admitting: Orthopedic Surgery

## 2016-12-07 DIAGNOSIS — M545 Low back pain, unspecified: Secondary | ICD-10-CM

## 2017-08-02 IMAGING — CR DG CHEST 2V
2 series · 2 of 2 positions shown · non-contrast
Comparison: None.

CLINICAL DATA: Midsternal chest pain with cough.

EXAM:
CHEST  2 VIEW

[w chest pa *]
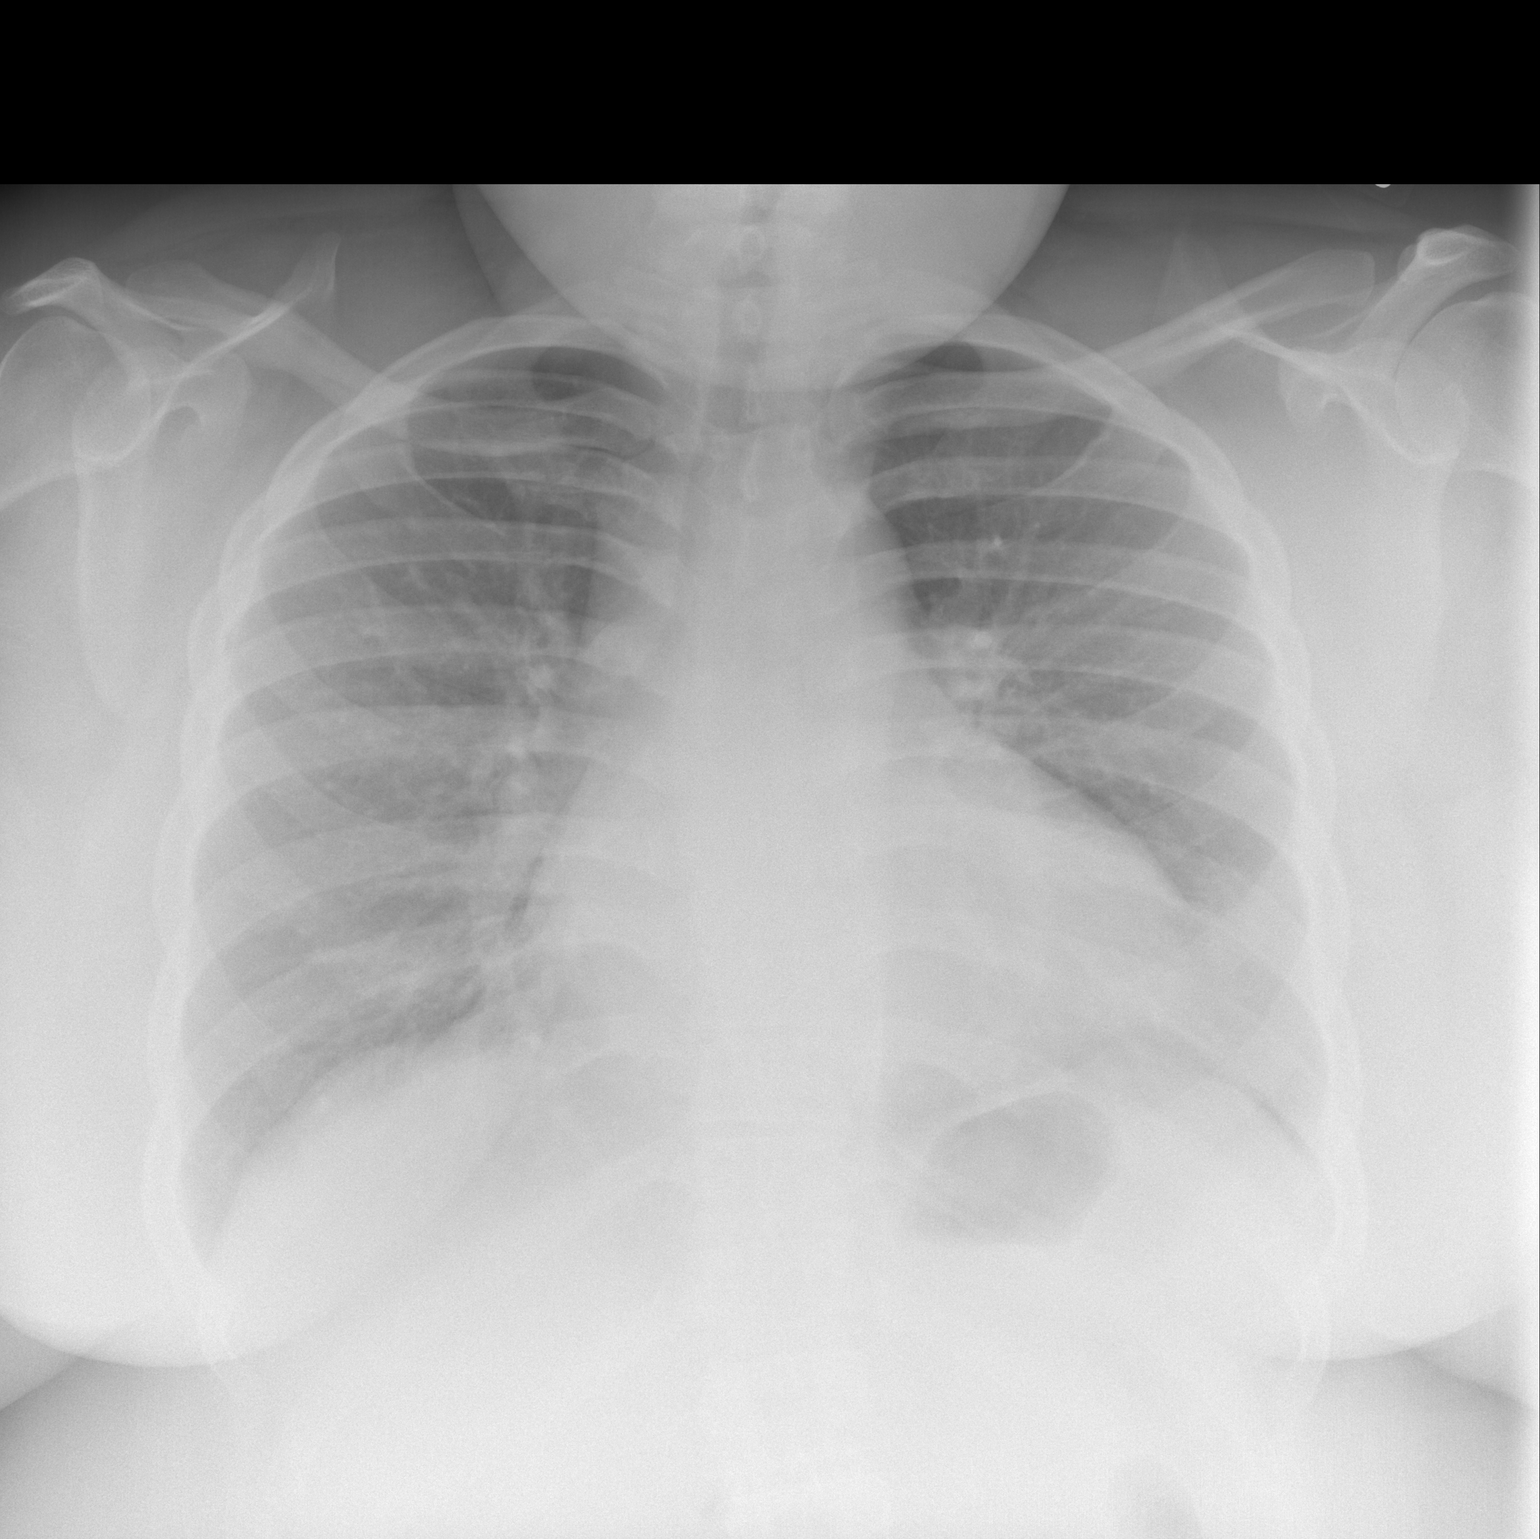

[w chest lat *]
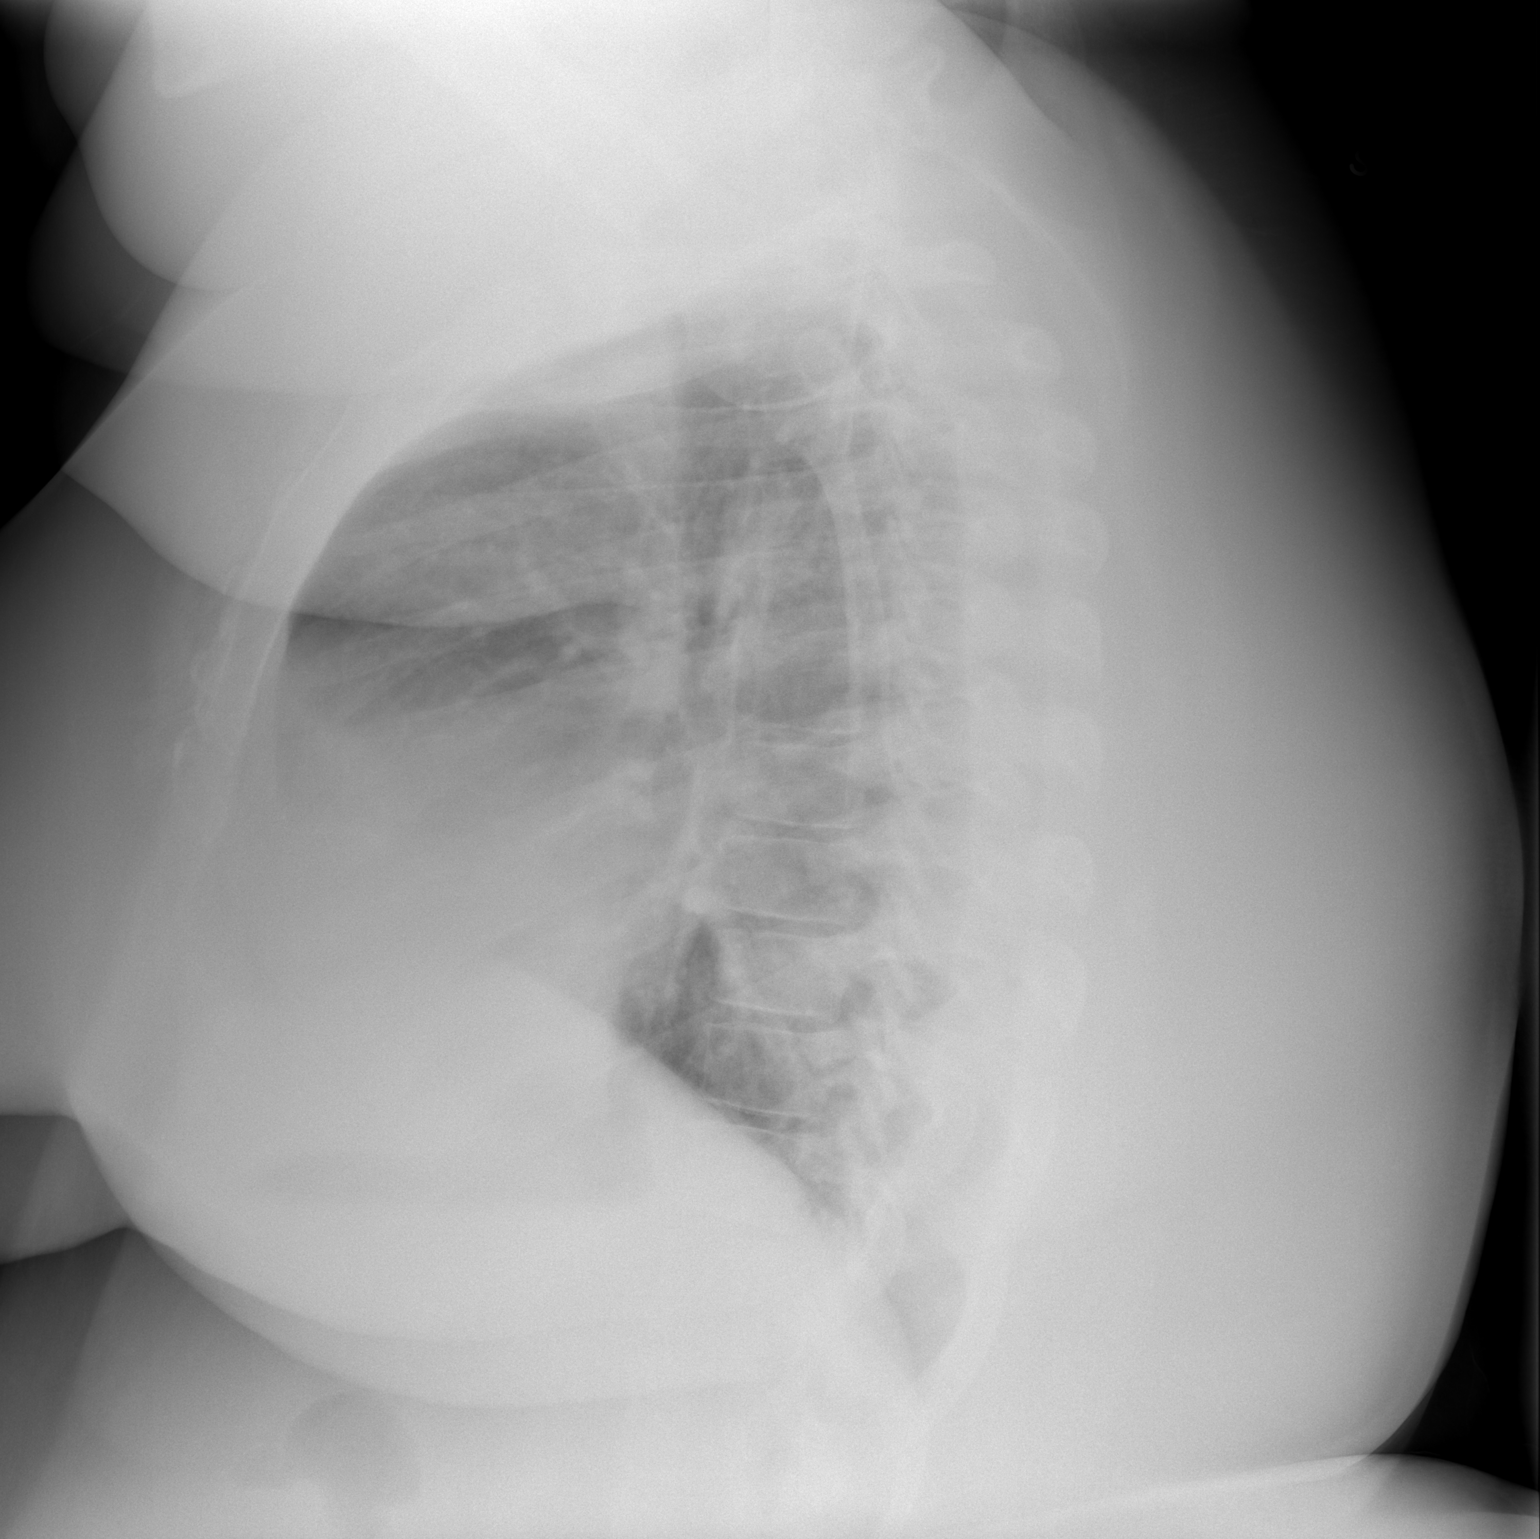

[2 of 2 positions shown; findings below may reference images not displayed]

FINDINGS: There is no focal parenchymal opacity. There is no pleural effusion
or pneumothorax. There is cardiomegaly.

The osseous structures are unremarkable.
IMPRESSION: Cardiomegaly.  Otherwise, no acute cardiopulmonary disease.

## 2017-09-07 ENCOUNTER — Other Ambulatory Visit: Payer: Self-pay

## 2017-09-07 ENCOUNTER — Encounter (HOSPITAL_COMMUNITY): Payer: Self-pay

## 2017-09-07 ENCOUNTER — Emergency Department (HOSPITAL_COMMUNITY)
Admission: EM | Admit: 2017-09-07 | Discharge: 2017-09-07 | Disposition: A | Discharge status: Home / Self Care | Payer: Self-pay | Attending: Emergency Medicine | Admitting: Emergency Medicine

## 2017-09-07 ENCOUNTER — Emergency Department (HOSPITAL_COMMUNITY): Payer: Self-pay

## 2017-09-07 DIAGNOSIS — R079 Chest pain, unspecified: Secondary | ICD-10-CM | POA: Insufficient documentation

## 2017-09-07 LAB — BASIC METABOLIC PANEL
Anion gap: 11 (ref 5–15)
BUN: 13 mg/dL (ref 6–20)
CALCIUM: 9.4 mg/dL (ref 8.9–10.3)
CHLORIDE: 107 mmol/L (ref 101–111)
CO2: 20 mmol/L — AB (ref 22–32)
Creatinine, Ser: 0.71 mg/dL (ref 0.44–1.00)
GFR calc Af Amer: >60 mL/min (ref >60)
GFR calc non Af Amer: >60 mL/min (ref >60)
GLUCOSE: 101 mg/dL — AB (ref 65–99)
Potassium: 3.7 mmol/L (ref 3.5–5.1)
Sodium: 138 mmol/L (ref 135–145)

## 2017-09-07 LAB — D-DIMER, QUANTITATIVE (NOT AT ARMC)

## 2017-09-07 LAB — CBC
HEMATOCRIT: 40.7 % (ref 36.0–46.0)
Hemoglobin: 13.4 g/dL (ref 12.0–15.0)
MCH: 27.7 pg (ref 26.0–34.0)
MCHC: 32.9 g/dL (ref 30.0–36.0)
MCV: 84.1 fL (ref 78.0–100.0)
Platelets: 240 10*3/uL (ref 150–400)
RBC: 4.84 MIL/uL (ref 3.87–5.11)
RDW: 13.5 % (ref 11.5–15.5)
WBC: 6.9 10*3/uL (ref 4.0–10.5)

## 2017-09-07 LAB — I-STAT BETA HCG BLOOD, ED (MC, WL, AP ONLY): I-stat hCG, quantitative: <5 m[IU]/mL (ref <5)

## 2017-09-07 LAB — I-STAT TROPONIN, ED: TROPONIN I, POC: 0 ng/mL (ref 0.00–0.08)

## 2017-09-07 MED ORDER — IBUPROFEN 600 MG PO TABS: 600.0000 mg | ORAL_TABLET | Freq: Four times a day (QID) | ORAL | 0 refills | Status: AC | PRN

## 2017-09-07 NOTE — ED Provider Notes (Signed)
[facility]-EMERGENCY DEPT Provider Note   CSN: [id number] Arrival date & time: 09/07/17  1243     History   Chief Complaint Chief Complaint  Patient presents with  . Chest Pain  . Headache    HPI Regina Koch is a 27 y.o. female.  HPI  27 year old female, she has a known history of a pulmonary embolism that occurred a couple of years ago and was thought to be brought on by a combination of obesity and oral contraceptive use.  She took Xarelto for approximately 1 year and then was taken off of that medication as she no longer had any symptoms and was thought to be lower risk after she stopped her estrogen containing tablets.  She was in her usual state of health until a couple of weeks ago when she started having recurrent pain in her chest, this was on the right side, sometimes on the left side, it is not related to breathing position or exertion and seems to be intermittent over the last several days though it is been more frequent yesterday and today.  Sometimes on the right side, sometimes on the left side, not associated with shortness of breath, swelling of the legs or recent trauma travel or immobilization.  She has not had any recent surgery and does not smoke cigarettes.  She has no prior history of cardiac disease, hypertension hypercholesterolemia or any other significant disease.  She is supposed to be taking vitamin D tablets which she takes intermittently.  Past Medical History:  Diagnosis Date  . Pulmonary embolism [facility])     Patient Active Problem List   Diagnosis Date Noted  . Morbid obesity (HCC) 02/19/2015  . On oral contraceptive pills for non-contraception indication 02/19/2015  . 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 No pertinent surgical history.   OB History   None      Home Medications    Prior to Admission medications   Medication Sig Start Date End Date Taking? Authorizing Provider    Cholecalciferol (VITAMIN D PO) Take 1 tablet by mouth daily.   Yes [provider]  ibuprofen (ADVIL,MOTRIN) 600 MG tablet Take 1 tablet (600 mg total) by mouth every 6 (six) hours as needed. 09/07/17   Eber Hong, MD    Family History History reviewed. No pertinent family history.  Social History Social History   Tobacco Use  . Smoking status: Never Smoker  . Smokeless tobacco: Never Used  Substance Use Topics  . Alcohol use: Yes    Comment: occasionally  . Drug use: No     Allergies   Patient has no known allergies.   Review of Systems Review of Systems  All other systems reviewed and are negative.    Physical Exam Updated Vital Signs BP 137/78   Pulse 68   Temp 98.6 F (37 C) (Oral)   Resp (!) 23   Ht 5' 4.5" (1.638 m)   Wt (!) 203.4 kg (448 lb 5 oz)   LMP 08/16/2017   SpO2 100%   BMI 75.76 kg/m   Physical Exam  Constitutional: She appears well-developed and well-nourished. No distress.  HENT:  Head: Normocephalic and atraumatic.  Mouth/Throat: Oropharynx is clear and moist. No oropharyngeal exudate.  Eyes: Pupils are equal, round, and reactive to light. Conjunctivae and EOM are normal. Right eye exhibits no discharge. Left eye exhibits no discharge. No scleral icterus.  Neck: Normal range of motion. Neck supple. No JVD present. No thyromegaly present.  Cardiovascular: Normal rate, regular rhythm, normal heart sounds and intact distal pulses. Exam reveals no gallop and no friction rub.  No murmur heard. Pulmonary/Chest: Effort normal and breath sounds normal. No respiratory distress. She has no wheezes. She has no rales.  Abdominal: Soft. Bowel sounds are normal. She exhibits no distension and no mass. There is no tenderness.  Musculoskeletal: Normal range of motion. She exhibits no edema or tenderness.  Lymphadenopathy:    She has no cervical adenopathy.  Neurological: She is alert. Coordination normal.  Skin: Skin is warm and dry. No rash  noted. No erythema.  Psychiatric: She has a normal mood and affect. Her behavior is normal.  Nursing note and vitals reviewed.    ED Treatments / Results  Labs (all labs ordered are listed, but only abnormal results are displayed) Labs Reviewed  BASIC METABOLIC PANEL - Abnormal; Notable for the following components:      Result Value   CO2 20 (*)    Glucose, Bld 101 (*)    All other components within normal limits  CBC  D-DIMER, QUANTITATIVE (NOT AT [facility])  I-STAT TROPONIN, ED  I-STAT BETA HCG BLOOD, ED ([facility], [facility], [facility] ONLY)    EKG EKG Interpretation  Date/Time:  Saturday September 07 2017 12:55:52 EDT Ventricular Rate:  77 PR Interval:    QRS Duration: 102 QT Interval:  383 QTC Calculation: 434 R Axis:   80 Text Interpretation:  Sinus rhythm ST elev, probable normal early repol pattern since last tracing no significant change Confirmed by Eber Hong ([id number]) on 09/07/2017 3:56:46 PM   Radiology Dg Chest 2 View  Result Date: 09/07/2017 CLINICAL DATA:  Right-sided chest pain for 2 weeks. Previous history of pulmonary embolism. EXAM: CHEST - 2 VIEW COMPARISON:  02/18/2015 FINDINGS: Mild cardiomegaly is stable. Both lungs are clear. No evidence of pleural effusion. IMPRESSION: Stable mild cardiomegaly.  No active lung disease. Electronically Signed   By: Myles Rosenthal M.D.   On: 09/07/2017 13:55    Procedures Procedures (including critical care time)  Medications Ordered in ED Medications - No data to display   Initial Impression / Assessment and Plan / ED Course  I have reviewed the triage vital signs and the nursing notes.  Pertinent labs & imaging results that were available during my care of the patient were reviewed by me and considered in my medical decision making (see chart for details).    Other than being morbidly obese the patient has no other abnormal findings on her exam.  She is not tachycardic hypoxic or in any respiratory distress and has an EKG which shows  what appears to be early repolarization diffusely and is unchanged from prior EKGs.  She is not tachycardic, she has no swelling of her legs, she has negative lab work and a d-dimer has been added.  If the d-dimer is elevated she will need a CT scan, otherwise reassurance and anti-inflammatories.  She does report that she recently started working out at the gym and thinks that may be using some of the arm and chest weights has caused her to have some chest pain.  D-dimer is negative, chest x-ray unremarkable, blood pressure has normalized to 137/78 (improved), patient was informed of her results and at this time has a pulse of 68 and an oxygen of 100%.  She appears stable for discharge, she is low risk for pulmonary embolism and thus with a low pretest probability and negative d-dimer functionally rules out pulmonary embolism.  She was  aware that this is not a perfect test and she should return should her symptoms worsen.  Final Clinical Impressions(s) / ED Diagnoses   Final diagnoses:  Intermittent chest pain    ED Discharge Orders        Ordered    ibuprofen (ADVIL,MOTRIN) 600 MG tablet  Every 6 hours PRN     09/07/17 1736       Eber Hong, MD 09/07/17 1737

## 2017-09-07 NOTE — Discharge Instructions (Signed)
Take ibuprofen as needed for pain 3 times a day however you should return to the emergency department for severe or worsening pain difficulty breathing fevers or shortness of breath

## 2017-09-07 NOTE — ED Triage Notes (Addendum)
Patient c/oi ntermittent mid and right chest x 2 weeks and a headache. Patient reports a history of PE.

## 2019-12-10 ENCOUNTER — Emergency Department (HOSPITAL_COMMUNITY): Payer: Self-pay

## 2019-12-10 ENCOUNTER — Other Ambulatory Visit: Payer: Self-pay

## 2019-12-10 ENCOUNTER — Emergency Department (HOSPITAL_BASED_OUTPATIENT_CLINIC_OR_DEPARTMENT_OTHER): Payer: Self-pay

## 2019-12-10 ENCOUNTER — Encounter (HOSPITAL_COMMUNITY): Payer: Self-pay

## 2019-12-10 ENCOUNTER — Emergency Department (HOSPITAL_COMMUNITY)
Admission: EM | Admit: 2019-12-10 | Discharge: 2019-12-10 | Disposition: A | Discharge status: Home / Self Care | Payer: Self-pay | Attending: Emergency Medicine | Admitting: Emergency Medicine

## 2019-12-10 DIAGNOSIS — I2699 Other pulmonary embolism without acute cor pulmonale: Secondary | ICD-10-CM

## 2019-12-10 DIAGNOSIS — M79605 Pain in left leg: Secondary | ICD-10-CM | POA: Insufficient documentation

## 2019-12-10 DIAGNOSIS — R0602 Shortness of breath: Secondary | ICD-10-CM | POA: Insufficient documentation

## 2019-12-10 DIAGNOSIS — R079 Chest pain, unspecified: Secondary | ICD-10-CM | POA: Insufficient documentation

## 2019-12-10 LAB — COMPREHENSIVE METABOLIC PANEL
ALT: 14 U/L (ref 0–44)
AST: 24 U/L (ref 15–41)
Albumin: 3.7 g/dL (ref 3.5–5.0)
Alkaline Phosphatase: 56 U/L (ref 38–126)
Anion gap: 13 (ref 5–15)
BUN: 12 mg/dL (ref 6–20)
CO2: 22 mmol/L (ref 22–32)
Calcium: 9.6 mg/dL (ref 8.9–10.3)
Chloride: 101 mmol/L (ref 98–111)
Creatinine, Ser: 0.57 mg/dL (ref 0.44–1.00)
GFR calc Af Amer: >60 mL/min (ref >60)
GFR calc non Af Amer: >60 mL/min (ref >60)
Glucose, Bld: 80 mg/dL (ref 70–99)
Potassium: 5 mmol/L (ref 3.5–5.1)
Sodium: 136 mmol/L (ref 135–145)
Total Bilirubin: 1 mg/dL (ref 0.3–1.2)
Total Protein: 8.3 g/dL — ABNORMAL HIGH (ref 6.5–8.1)

## 2019-12-10 LAB — CBC WITH DIFFERENTIAL/PLATELET
Abs Immature Granulocytes: 0.02 10*3/uL (ref 0.00–0.07)
Basophils Absolute: 0 10*3/uL (ref 0.0–0.1)
Basophils Relative: 0 %
Eosinophils Absolute: 0.1 10*3/uL (ref 0.0–0.5)
Eosinophils Relative: 1 %
HCT: 44.7 % (ref 36.0–46.0)
Hemoglobin: 13.9 g/dL (ref 12.0–15.0)
Immature Granulocytes: 0 %
Lymphocytes Relative: 35 %
Lymphs Abs: 3.1 10*3/uL (ref 0.7–4.0)
MCH: 27.1 pg (ref 26.0–34.0)
MCHC: 31.1 g/dL (ref 30.0–36.0)
MCV: 87.1 fL (ref 80.0–100.0)
Monocytes Absolute: 0.6 10*3/uL (ref 0.1–1.0)
Monocytes Relative: 7 %
Neutro Abs: 5.1 10*3/uL (ref 1.7–7.7)
Neutrophils Relative %: 57 %
Platelets: 265 10*3/uL (ref 150–400)
RBC: 5.13 MIL/uL — ABNORMAL HIGH (ref 3.87–5.11)
RDW: 13.6 % (ref 11.5–15.5)
WBC: 8.9 10*3/uL (ref 4.0–10.5)
nRBC: 0 % (ref 0.0–0.2)

## 2019-12-10 LAB — TROPONIN I (HIGH SENSITIVITY): Troponin I (High Sensitivity): <2 ng/L (ref <18)

## 2019-12-10 MED ORDER — IOHEXOL 350 MG/ML SOLN
100.0000 mL | Freq: Once | INTRAVENOUS | Status: AC | PRN
  Administered 2019-12-10: 100 mL via INTRAVENOUS

## 2019-12-10 MED ORDER — SODIUM CHLORIDE (PF) 0.9 % IJ SOLN
INTRAMUSCULAR | Status: AC
  Filled 2019-12-10: qty 50

## 2019-12-10 NOTE — Progress Notes (Signed)
Lower extremity venous bilateral study completed.   See Cv Proc for preliminary results.   Jolanda Mccann  

## 2019-12-10 NOTE — ED Notes (Signed)
An After Visit Summary was printed and given to the patient. Discharge instructions given and no further questions at this time.  

## 2019-12-10 NOTE — ED Triage Notes (Signed)
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 Patient states she has been having a headache and pins and needles feeling in her fingertips at times.

## 2019-12-10 NOTE — ED Provider Notes (Signed)
[facility]-EMERGENCY DEPT Provider Note   CSN: [id number] Arrival date & time: 12/10/19  6468     History Chief Complaint  Patient presents with  . Leg Pain    Regina Koch is a 29 y.o. female.  HPI  29 year old female, with a PMH of PE no longer on anticoagulation, presents with multiple complaints.  Patient states for the last 1 to 2 months she has had pain of her bilateral lower extremities.  She notes pain started in her calves and then worked up into her thighs.  She has not followed up with primary care, not had any ultrasounds of her legs.  She states that yesterday she had a "funny feeling in her chest."  She states that it is on the right side, nonradiating.  She notes chronic shortness of breath due to her weight but states that it has not changed or worsened.  She denies any cough, sputum production, fevers, chills, nausea, vomiting.       Past Medical History:  Diagnosis Date  . Pulmonary embolism [facility])     Patient Active Problem List   Diagnosis Date Noted  . Morbid obesity (HCC) 02/19/2015  . On oral contraceptive pills for non-contraception indication 02/19/2015  . 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 No pertinent surgical history.   OB History   No obstetric history on file.     Family History  Problem Relation Age of Onset  . Cancer Mother     Social History   Tobacco Use  . Smoking status: Never Smoker  . Smokeless tobacco: Never Used  Vaping Use  . Vaping Use: Never used  Substance Use Topics  . Alcohol use: Yes    Comment: occasionally  . Drug use: No    Home Medications Prior to Admission medications   Medication Sig Start Date End Date Taking? Authorizing Provider  cholecalciferol (VITAMIN D3) 25 MCG (1000 UNIT) tablet Take 1,000 Units by mouth daily.   Yes [provider]  vitamin C (ASCORBIC ACID) 250 MG tablet Take 250 mg by mouth daily.   Yes [provider]    ibuprofen (ADVIL,MOTRIN) 600 MG tablet Take 1 tablet (600 mg total) by mouth every 6 (six) hours as needed. Patient not taking: Reported on 12/10/2019 09/07/17   Eber Hong, MD    Allergies    Patient has no known allergies.  Review of Systems   Review of Systems  Constitutional: Negative for chills and fever.  Respiratory: Positive for shortness of breath (chronic).   Cardiovascular: Positive for chest pain.  Gastrointestinal: Negative for abdominal pain, nausea and vomiting.  Genitourinary: Negative for dysuria.  Musculoskeletal: Positive for arthralgias.  All other systems reviewed and are negative.   Physical Exam Updated Vital Signs BP (!) 131/64   Pulse 80   Temp 98.5 F (36.9 C) (Oral)   Resp 18   Ht 5\' 5"  (1.651 m)   Wt (!) 195 kg   LMP 11/15/2019   SpO2 99%   BMI 71.56 kg/m   Physical Exam Vitals and nursing note reviewed.  Constitutional:      Appearance: She is well-developed.  HENT:     Head: Normocephalic and atraumatic.  Eyes:     Conjunctiva/sclera: Conjunctivae normal.  Cardiovascular:     Rate and Rhythm: Normal rate and regular rhythm.     Heart sounds: Normal heart sounds. No murmur heard.   Pulmonary:     Effort: Pulmonary effort is normal. No  respiratory distress.     Breath sounds: Normal breath sounds. No wheezing or rales.  Abdominal:     General: Bowel sounds are normal. There is no distension.     Palpations: Abdomen is soft.     Tenderness: There is no abdominal tenderness.  Musculoskeletal:        General: No tenderness or deformity. Normal range of motion.     Cervical back: Neck supple.     Right lower leg: Normal. No swelling or tenderness. No edema.     Left lower leg: Normal. No swelling or tenderness. No edema.  Skin:    General: Skin is warm and dry.     Findings: No erythema or rash.  Neurological:     Mental Status: She is alert and oriented to person, place, and time.  Psychiatric:        Behavior: Behavior normal.      ED Results / Procedures / Treatments   Labs (all labs ordered are listed, but only abnormal results are displayed) Labs Reviewed  CBC WITH DIFFERENTIAL/PLATELET - Abnormal; Notable for the following components:      Result Value   RBC 5.13 (*)    All other components within normal limits  COMPREHENSIVE METABOLIC PANEL - Abnormal; Notable for the following components:   Total Protein 8.3 (*)    All other components within normal limits  PROTIME-INR  HCG, QUANTITATIVE, PREGNANCY  TROPONIN I (HIGH SENSITIVITY)    EKG EKG Interpretation  Date/Time:  Thursday December 10 2019 11:30:41 EDT Ventricular Rate:  79 PR Interval:    QRS Duration: 94 QT Interval:  375 QTC Calculation: 430 R Axis:   74 Text Interpretation: Sinus rhythm ST elev, probable normal early repol pattern No significant change since last tracing Confirmed by Linwood Dibbles [phone]) on 12/10/2019 11:36:58 AM   Radiology CT Angio Chest PE W/Cm &/Or Wo Cm  Result Date: 12/10/2019 CLINICAL DATA:  Concern for pulmonary embolism. EXAM: CT ANGIOGRAPHY CHEST WITH CONTRAST TECHNIQUE: Multidetector CT imaging of the chest was performed using the standard protocol during bolus administration of intravenous contrast. Multiplanar CT image reconstructions and MIPs were obtained to evaluate the vascular anatomy. CONTRAST:  OMNIPAQUE IOHEXOL 350 MG/ML SOLN COMPARISON:  Chest radiograph dated 09/07/2017 and CT chest dated 02/18/2015. FINDINGS: Cardiovascular: The exam is limited due to patient body habitus and quantum mottle. Given this limitation, satisfactory opacification of the pulmonary arteries to the segmental level. No evidence of pulmonary embolism. The heart is enlarged. No pericardial effusion. Mediastinum/Nodes: No enlarged mediastinal, hilar, or axillary lymph nodes. Thyroid gland, trachea, and esophagus demonstrate no significant findings. Lungs/Pleura: Lungs are clear. No pleural effusion or pneumothorax. Upper Abdomen:  No acute abnormality. Musculoskeletal: No chest wall abnormality. No acute or significant osseous findings. Review of the MIP images confirms the above findings. IMPRESSION: 1. Negative for pulmonary embolism. 2. Cardiomegaly. Electronically Signed   By: Romona Curls M.D.   On: 12/10/2019 16:42   VAS [place] LOWER EXTREMITY VENOUS (DVT) ([facility] and [facility] 7a-7p)  Result Date: 12/10/2019  Lower Venous DVTStudy Indications: Personal history of PE, and Pain.  Risk Factors: 02-18-2015 Bilateral pulmonary embolism. Limitations: Body habitus and poor ultrasound/tissue interface. Comparison Study: Prior study 02-19-2015 Performing Technologist: Jean Rosenthal  Examination Guidelines: A complete evaluation includes B-mode imaging, spectral Doppler, color Doppler, and power Doppler as needed of all accessible portions of each vessel. Bilateral testing is considered an integral part of a complete examination. Limited examinations for reoccurring indications may be performed  as noted. The reflux portion of the exam is performed with the patient in reverse Trendelenburg.  +---------+---------------+---------+-----------+----------+--------------+ RIGHT    CompressibilityPhasicitySpontaneityPropertiesThrombus Aging +---------+---------------+---------+-----------+----------+--------------+ CFV                     Yes      Yes                                 +---------+---------------+---------+-----------+----------+--------------+ SFJ                     Yes      Yes                                 +---------+---------------+---------+-----------+----------+--------------+ FV Prox                 Yes      Yes                                 +---------+---------------+---------+-----------+----------+--------------+ FV Mid                  Yes      Yes                                 +---------+---------------+---------+-----------+----------+--------------+ FV Distal               Yes      Yes                                  +---------+---------------+---------+-----------+----------+--------------+ PFV                                                   Not visualized +---------+---------------+---------+-----------+----------+--------------+ POP      Full           Yes      Yes                                 +---------+---------------+---------+-----------+----------+--------------+ PTV      Full           Yes      Yes                                 +---------+---------------+---------+-----------+----------+--------------+ PERO                    Yes      Yes                                 +---------+---------------+---------+-----------+----------+--------------+   +---------+---------------+---------+-----------+----------+--------------+ LEFT     CompressibilityPhasicitySpontaneityPropertiesThrombus Aging +---------+---------------+---------+-----------+----------+--------------+ CFV                     Yes      Yes                                 +---------+---------------+---------+-----------+----------+--------------+  SFJ                     Yes      Yes                                 +---------+---------------+---------+-----------+----------+--------------+ FV Prox                 Yes      Yes                                 +---------+---------------+---------+-----------+----------+--------------+ FV Mid                  Yes      Yes                                 +---------+---------------+---------+-----------+----------+--------------+ FV Distal               Yes      Yes                                 +---------+---------------+---------+-----------+----------+--------------+ PFV                                                   Not visualized +---------+---------------+---------+-----------+----------+--------------+ POP      Full           Yes      Yes                                  +---------+---------------+---------+-----------+----------+--------------+ PTV                     Yes      Yes                                 +---------+---------------+---------+-----------+----------+--------------+ PERO                    Yes      Yes                                 +---------+---------------+---------+-----------+----------+--------------+     Summary: RIGHT: - There is no evidence of deep vein thrombosis in the lower extremity. However, portions of this examination were limited- see technologist comments above.  - No cystic structure found in the popliteal fossa.  LEFT: - There is no evidence of deep vein thrombosis in the lower extremity. However, portions of this examination were limited- see technologist comments above.  - No cystic structure found in the popliteal fossa.  *See table(s) above for measurements and observations. Electronically signed by Sherald Hess MD on 12/10/2019 at 3:52:52 PM.    Final     Procedures Procedures (including critical care time)  Medications Ordered in ED Medications  sodium chloride (PF) 0.9 % injection (has no administration in time range)  sodium chloride (PF) 0.9 %  injection (  Given by Other 12/10/19 1438)  sodium chloride (PF) 0.9 % injection (  Given by Other 12/10/19 1444)  iohexol (OMNIPAQUE) 350 MG/ML injection 100 mL (100 mLs Intravenous Contrast Given 12/10/19 1446)    ED Course  I have reviewed the triage vital signs and the nursing notes.  Pertinent labs & imaging results that were available during my care of the patient were reviewed by me and considered in my medical decision making (see chart for details).    MDM Rules/Calculators/A&P                          Presents with multiple complaints, leg pain, transient chest pain.  Patient with a history of PE.  Physical exam unremarkable.  Vital signs stable.  Blood work unremarkable.  Ultrasound of bilateral lower extremities shows no evidence of DVT.   Ultrasound somewhat limited due to body habitus.  CT angio of the chest shows no evidence of PE.  Discussed findings with patient.  Discussed that she needs close follow-up with PCP.  She does not have PCP at this time so will provide with community health and wellness.  She was given strict return precautions and expressed understanding.  Heart score is low, only 1 troponin.  History and physical is not consistent with ACS, dissection, pneumonia, pneumothorax, pleural effusion.  Patient ready and stable for discharge.    At this time there does not appear to be any evidence of an acute emergency medical condition and the patient appears stable for discharge with appropriate outpatient follow up.Diagnosis was discussed with patient who verbalizes understanding and is agreeable to discharge.  Final Clinical Impression(s) / ED Diagnoses Final diagnoses:  None    Rx / DC Orders ED Discharge Orders    None       Rueben Bash 12/10/19 2033    Linwood Dibbles, MD 12/11/19 1001

## 2019-12-10 NOTE — Discharge Instructions (Addendum)
Please follow-up with your primary care provider soon as possible for continued evaluation.  Return to the emergency room immediately for new or worsening symptoms or concerns, such as chest pain, shortness of breath, fevers, or any concerns at all.

## 2020-01-12 ENCOUNTER — Encounter: Payer: Self-pay | Admitting: Internal Medicine

## 2020-01-12 ENCOUNTER — Ambulatory Visit: Payer: Medicaid Other | Attending: Internal Medicine | Admitting: Family

## 2020-01-12 VITALS — Ht 64.0 in | Wt >= 6400 oz

## 2020-01-12 DIAGNOSIS — Z09 Encounter for follow-up examination after completed treatment for conditions other than malignant neoplasm: Secondary | ICD-10-CM

## 2020-01-12 DIAGNOSIS — Z7689 Persons encountering health services in other specified circumstances: Secondary | ICD-10-CM

## 2020-01-12 NOTE — Progress Notes (Signed)
Virtual Visit via Telephone Note  I connected with Regina Koch, on 01/12/2020 at 3:22 PM by telephone due to the COVID-19 pandemic and verified that I am speaking with the correct person using two identifiers.  Due to current restrictions/limitations of in-office visits due to the COVID-19 pandemic, this scheduled clinical appointment was converted to a telehealth visit.   Consent: I discussed the limitations, risks, security and privacy concerns of performing an evaluation and management service by telephone and the availability of in person appointments. I also discussed with the patient that there may be a patient responsible charge related to this service. The patient expressed understanding and agreed to proceed.  Location of Patient: Home  Location of Provider: [facility]  Persons participating in Telemedicine visit: Nayleen Janosik Ricky Stabs, NP Laruth Bouchard, CMA  History of Present Illness: Regina Koch is a 29 year old female with history of bilateral pulmonary embolism and morbid obesity who presents for hospital follow-up and establish care.   1. ER FOLLOW UP: Time since discharge: 32 days  Hospital/facility: [facility] Emergency Department  Diagnosis: bilateral leg pain Procedures/tests: CMP, CBC, PROTIME-INR, HCG QUANTITATIVE PREGNANCY, TROPONIN, EKG, CT ANGIO CHEST PE, VASCULAR LOWER EXTREMITY VENOUS DVT Consultants: none New medications: none Discharge instructions:  Follow-up with PCP and given strict return precautions Status: better Today reports she is having bilateral leg throbbing and that this but not too frequent. Denies pain. Denies swelling. Reports worse at night.  Muscle spasms sometimes. Denies shortness of breath and chest pain. Reports history of DVT around 2017. Reports at that time she was taking Xarelto. Reports after being on medication for 1 year it was discontinued.  2. ESTABLISH CARE: Reports she  did not have previous primary provider.   PAST HISTORY: Denies childhood illnesses. Denies previous surgeries. Denies psychiatric illnesses. Denies obstetric history.   FAMILY HISTORY: Reports sister has diabetes and cancer prevalent on maternal and paternal sides of family.  PERSONAL AND SOCIAL HISTORY: Reports she is not currently employed. Taking classes at [employer] Lives alone. Walks dogs for exercise. Reports she can improve on diet.    Past Medical History:  Diagnosis Date  . Pulmonary embolism (HCC)    No Known Allergies  Current Outpatient Medications on File Prior to Visit  Medication Sig Dispense Refill  . cholecalciferol (VITAMIN D3) 25 MCG (1000 UNIT) tablet Take 1,000 Units by mouth daily. (Patient not taking: Reported on 01/12/2020)    . ibuprofen (ADVIL,MOTRIN) 600 MG tablet Take 1 tablet (600 mg total) by mouth every 6 (six) hours as needed. (Patient not taking: Reported on 12/10/2019) 30 tablet 0  . vitamin C (ASCORBIC ACID) 250 MG tablet Take 250 mg by mouth daily. (Patient not taking: Reported on 01/12/2020)     No current facility-administered medications on file prior to visit.    Observations/Objective: Alert and oriented x 3. Not in acute distress. Physical examination not completed as this is a telemedicine visit.  Assessment and Plan: 1. Hospital discharge follow-up: - Last visit 12/10/2019 at the [facility] Emergency Department with bilateral leg pain. Blood work unremarkable. Ultrasound of bilateral lower extremities shows no evidence of DVT. CT angio of the chest showed no evidence of PE. -  Today reports feeling somewhat better. Reports she is having bilateral leg throbbing and that this but not too frequent. Denies pain. Denies swelling. Denies chest pain. Denies shortness of breath. Reports worse at night.  Muscle spasms sometimes. Reports history of DVT around 2017. Reports  at that time she was taking Xarelto. Reports after being on medication for 1  year it was discontinued. - Patient was given clear instructions to go to Emergency Department or return to medical center if symptoms don't improve, worsen, or new problems develop.The patient verbalized understanding.  2. Encounter to establish care: - Patient presents today to establish care. Discussed patient's health history. Encouraged patient to make appointment to be seen in office for annual physical examination and screenings. Patient verbalized understanding.   Follow Up Instructions: Follow-up with primary physician within 1 - 2 months or sooner if needed.   Patient was given clear instructions to go to Emergency Department or return to medical center if symptoms don't improve, worsen, or new problems develop.The patient verbalized understanding.  I discussed the assessment and treatment plan with the patient. The patient was provided an opportunity to ask questions and all were answered. The patient agreed with the plan and demonstrated an understanding of the instructions.   The patient was advised to call back or seek an in-person evaluation if the symptoms worsen or if the condition fails to improve as anticipated.    I provided 16 minutes total of non-face-to-face time during this encounter including median intraservice time, reviewing previous notes, labs, imaging, medications, management and patient verbalized understanding.    Rema Fendt, NP  [facility] [place], [place] [phone]   01/12/2020, 3:22 PM

## 2020-01-12 NOTE — Patient Instructions (Signed)
Follow-up in 1 - 2 months or sooner if needed with primary physician for annual physical examination and screenings.  Deep Vein Thrombosis  Deep vein thrombosis (DVT) is a condition in which a blood clot forms in a deep vein, such as a lower leg, thigh, or arm vein. A clot is blood that has thickened into a gel or solid. This condition is dangerous. It can lead to serious and even life-threatening complications if the clot travels to the lungs and causes a blockage (pulmonary embolism). It can also damage veins in the leg. This can result in leg pain, swelling, discoloration, and sores (post-thrombotic syndrome). What are the causes? This condition may be caused by:  A slowdown of blood flow.  Damage to a vein.  A condition that causes blood to clot more easily, such as an inherited clotting disorder. What increases the risk? The following factors may make you more likely to develop this condition:  Being overweight.  Being older, especially over age 20.  Sitting or lying down for more than four hours.  Being in the hospital.  Lack of physical activity (sedentary lifestyle).  Pregnancy, being in childbirth, or having recently given birth.  Taking medicines that contain estrogen, such as medicines to prevent pregnancy.  Smoking.  A history of any of the following: ? Blood clots or a blood clotting disease. ? Peripheral vascular disease. ? Inflammatory bowel disease. ? Cancer. ? Heart disease. ? Genetic conditions that affect how your blood clots, such as Factor V Leiden mutation. ? Neurological diseases that affect your legs (leg paresis). ? A recent injury, such as a car accident. ? Major or lengthy surgery. ? A central line placed inside a large vein. What are the signs or symptoms? Symptoms of this condition include:  Swelling, pain, or tenderness in an arm or leg.  Warmth, redness, or discoloration in an arm or leg. If the clot is in your leg, symptoms may be more  noticeable or worse when you stand or walk. Some people may not develop any symptoms. How is this diagnosed? This condition is diagnosed with:  A medical history and physical exam.  Tests, such as: ? Blood tests. These are done to check how well your blood clots. ? Ultrasound. This is done to check for clots. ? Venogram. For this test, contrast dye is injected into a vein and X-rays are taken to check for any clots. How is this treated? Treatment for this condition depends on:  The cause of your DVT.  Your risk for bleeding or developing more clots.  Any other medical conditions that you have. Treatment may include:  Taking a blood thinner (anticoagulant). This type of medicine prevents clots from forming. It may be taken by mouth, injected under the skin, or injected through an IV (catheter).  Injecting clot-dissolving medicines into the affected vein (catheter-directed thrombolysis).  Having surgery. Surgery may be done to: ? Remove the clot. ? Place a filter in a large vein to catch blood clots before they reach the lungs. Some treatments may be continued for up to six months. Follow these instructions at home: If you are taking blood thinners:  Take the medicine exactly as told by your health care provider. Some blood thinners need to be taken at the same time every day. Do not skip a dose.  Talk with your health care provider before you take any medicines that contain aspirin or NSAIDs. These medicines increase your risk for dangerous bleeding.  Ask your health care  provider about foods and drugs that could change the way the medicine works (may interact). Avoid those things if your health care provider tells you to do so.  Blood thinners can cause easy bruising and may make it difficult to stop bleeding. Because of this: ? Be very careful when using knives, scissors, or other sharp objects. ? Use an electric razor instead of a blade. ? Avoid activities that could cause  injury or bruising, and follow instructions about how to prevent falls.  Wear a medical alert bracelet or carry a card that lists what medicines you take. General instructions  Take over-the-counter and prescription medicines only as told by your health care provider.  Return to your normal activities as told by your health care provider. Ask your health care provider what activities are safe for you.  Wear compression stockings if recommended by your health care provider.  Keep all follow-up visits as told by your health care provider. This is important. How is this prevented? To lower your risk of developing this condition again:  For 30 or more minutes every day, do an activity that: ? Involves moving your arms and legs. ? Increases your heart rate.  When traveling for longer than four hours: ? Exercise your arms and legs every hour. ? Drink plenty of water. ? Avoid drinking alcohol.  Avoid sitting or lying for a long time without moving your legs.  If you have surgery or you are hospitalized, ask about ways to prevent blood clots. These may include taking frequent walks or using anticoagulants.  Stay at a healthy weight.  If you are a woman who is older than age 65, avoid unnecessary use of medicines that contain estrogen, such as some birth control pills.  Do not use any products that contain nicotine or tobacco, such as cigarettes and e-cigarettes. This is especially important if you take estrogen medicines. If you need help quitting, ask your health care provider. Contact a health care provider if:  You miss a dose of your blood thinner.  Your menstrual period is heavier than usual.  You have unusual bruising. Get help right away if:  You have: ? New or increased pain, swelling, or redness in an arm or leg. ? Numbness or tingling in an arm or leg. ? Shortness of breath. ? Chest pain. ? A rapid or irregular heartbeat. ? A severe headache or confusion. ? A cut  that will not stop bleeding.  There is blood in your vomit, stool, or urine.  You have a serious fall or accident, or you hit your head.  You feel light-headed or dizzy.  You cough up blood. These symptoms may represent a serious problem that is an emergency. Do not wait to see if the symptoms will go away. Get medical help right away. Call your local emergency services (911 in the [place] Do not drive yourself to the hospital. Summary  Deep vein thrombosis (DVT) is a condition in which a blood clot forms in a deep vein, such as a lower leg, thigh, or arm vein.  Symptoms can include swelling, warmth, pain, and redness in your leg or arm.  This condition may be treated with a blood thinner (anticoagulant medicine), medicine that is injected to dissolve blood clots,compression stockings, or surgery.  If you are prescribed blood thinners, take them exactly as told. This information is not intended to replace advice given to you by your health care provider. Make sure you discuss any questions you have with your  health care provider. Document Revised: 04/12/2017 Document Reviewed: 09/28/2016 [employer] Patient Education  2020 [employer].
